# Patient Record
Sex: Male | Born: 1980 | Race: Black or African American | Hispanic: No | Marital: Single | Smoking: Never smoker
Health system: Southern US, Community
[De-identification: ages and names within clinical notes are randomized; demographics above are authoritative.]

---

## 2002-01-26 ENCOUNTER — Emergency Department (HOSPITAL_COMMUNITY): Admission: EM | Admit: 2002-01-26 | Discharge: 2002-01-26 | Payer: Self-pay | Admitting: Emergency Medicine

## 2002-01-26 ENCOUNTER — Encounter: Payer: Self-pay | Admitting: Emergency Medicine

## 2005-07-17 ENCOUNTER — Emergency Department (HOSPITAL_COMMUNITY): Admission: EM | Admit: 2005-07-17 | Discharge: 2005-07-17 | Payer: Self-pay | Admitting: Family Medicine

## 2007-04-26 ENCOUNTER — Emergency Department (HOSPITAL_COMMUNITY): Admission: EM | Admit: 2007-04-26 | Discharge: 2007-04-26 | Payer: Self-pay | Admitting: Family Medicine

## 2009-01-09 ENCOUNTER — Emergency Department (HOSPITAL_COMMUNITY): Admission: EM | Admit: 2009-01-09 | Discharge: 2009-01-09 | Payer: Self-pay | Admitting: Emergency Medicine

## 2010-08-29 LAB — GC/CHLAMYDIA PROBE AMP, GENITAL: GC Probe Amp, Genital: POSITIVE — AB

## 2011-02-01 ENCOUNTER — Emergency Department (HOSPITAL_COMMUNITY)
Admission: EM | Admit: 2011-02-01 | Discharge: 2011-02-01 | Disposition: A | Discharge status: Home / Self Care | Payer: Self-pay | Attending: Emergency Medicine | Admitting: Emergency Medicine

## 2011-02-01 DIAGNOSIS — R369 Urethral discharge, unspecified: Secondary | ICD-10-CM | POA: Insufficient documentation

## 2011-02-01 DIAGNOSIS — N342 Other urethritis: Secondary | ICD-10-CM | POA: Insufficient documentation

## 2011-02-01 DIAGNOSIS — R209 Unspecified disturbances of skin sensation: Secondary | ICD-10-CM | POA: Insufficient documentation

## 2012-12-08 ENCOUNTER — Emergency Department (HOSPITAL_COMMUNITY)
Admission: EM | Admit: 2012-12-08 | Discharge: 2012-12-08 | Disposition: A | Discharge status: Home / Self Care | Payer: Self-pay | Attending: Emergency Medicine | Admitting: Emergency Medicine

## 2012-12-08 ENCOUNTER — Encounter (HOSPITAL_COMMUNITY): Payer: Self-pay | Admitting: *Deleted

## 2012-12-08 DIAGNOSIS — N342 Other urethritis: Secondary | ICD-10-CM | POA: Insufficient documentation

## 2012-12-08 DIAGNOSIS — R300 Dysuria: Secondary | ICD-10-CM | POA: Insufficient documentation

## 2012-12-08 MED ORDER — CEFTRIAXONE SODIUM 250 MG IJ SOLR
250.0000 mg | Freq: Once | INTRAMUSCULAR | Status: AC
  Administered 2012-12-08: 250 mg via INTRAMUSCULAR
  Filled 2012-12-08: qty 250

## 2012-12-08 MED ORDER — LIDOCAINE HCL (PF) 1 % IJ SOLN
INTRAMUSCULAR | Status: AC
  Administered 2012-12-08: 5 mL
  Filled 2012-12-08: qty 5

## 2012-12-08 MED ORDER — AZITHROMYCIN 250 MG PO TABS
1000.0000 mg | ORAL_TABLET | Freq: Once | ORAL | Status: AC
  Administered 2012-12-08: 1000 mg via ORAL
  Filled 2012-12-08: qty 4

## 2012-12-08 NOTE — ED Provider Notes (Signed)
History/physical exam/procedure(s) were performed by non-physician practitioner and as supervising physician I was immediately available for consultation/collaboration. I have reviewed all notes and am in agreement with care and plan.   Hilario Quarry, MD 12/08/12 [phone]

## 2012-12-08 NOTE — ED Notes (Signed)
Reports having burning pain and penile discharge x 4 days. No distress noted at triage.

## 2012-12-08 NOTE — ED Provider Notes (Signed)
   History    CSN: [id number] Arrival date & time 12/08/12  1042  First MD Initiated Contact with Patient 12/08/12 1128     Chief Complaint  Patient presents with  . Penile Discharge   (Consider location/radiation/quality/duration/timing/severity/associated sxs/prior Treatment) HPI Comments: Patient presents with complaint of clear penile discharge and a tingling sensation with urination for the past 4 days. Patient admits to having unprotected sexual intercourse one week ago. He is uncertain if his partner had a 60 transmitted disease. Patient has history of 6 L and related disease "a long time ago". He denies other symptoms including fever, sore throat, skin lesions. Onset of symptoms gradual. Course is constant. Nothing makes symptoms better or worse.  Patient is a 32 y.o. male presenting with penile discharge. The history is provided by the patient.  Penile Discharge Pertinent negatives include no arthralgias, fever, rash or sore throat.   History reviewed. No pertinent past medical history. History reviewed. No pertinent past surgical history. History reviewed. No pertinent family history. History  Substance Use Topics  . Smoking status: Not on file  . Smokeless tobacco: Not on file  . Alcohol Use: No    Review of Systems  Constitutional: Negative for fever.  HENT: Negative for sore throat.   Eyes: Negative for discharge.  Gastrointestinal: Negative for rectal pain.  Genitourinary: Positive for dysuria and discharge. Negative for frequency, genital sores, penile pain and testicular pain.  Musculoskeletal: Negative for arthralgias.  Skin: Negative for rash.  Hematological: Negative for adenopathy.    Allergies  Review of patient's allergies indicates no known allergies.  Home Medications   Current Outpatient Rx  Name  Route  Sig  Dispense  Refill  . Multiple Vitamins-Minerals (MULTIVITAMIN WITH MINERALS) tablet   Oral   Take 1 tablet by mouth daily.          BP  129/82  Pulse 68  Temp(Src) 98 F (36.7 C) (Oral)  Resp 18  SpO2 97% Physical Exam  Nursing note and vitals reviewed. Constitutional: He appears well-developed and well-nourished.  HENT:  Head: Normocephalic and atraumatic.  Eyes: Conjunctivae are normal.  Neck: Normal range of motion. Neck supple.  Pulmonary/Chest: No respiratory distress.  Genitourinary: Testes normal and penis normal. Circumcised. No penile erythema or penile tenderness. No discharge found.  Neurological: He is alert.  Skin: Skin is warm and dry.  Psychiatric: He has a normal mood and affect.    ED Course  Procedures (including critical care time) Labs Reviewed - No data to display No results found. 1. Urethritis     11:41 AM Patient seen and examined. Work-up initiated. Medications ordered.   Vital signs reviewed and are as follows: Filed Vitals:   12/08/12 1053  BP: 129/82  Pulse: 68  Temp: 98 F (36.7 C)  Resp: 18   Pt seen and examined.  Will test and treat for STD exposure.  Patient counseled on safe sexual practices.  Told them that they should not have sexual contact for next 7 days and that they need to inform sexual partners so that they can get tested and treated as well.  Urged f/u with [place] [facility] for HIV and syphilis testing.  Patient verbalizes understanding and agrees with plan.      MDM  Penile d/c. No systemic symptoms. Pt appears well. Treated presumptively for GC/chlamydia.   Karam Dunson, PA-C 12/08/12 1146

## 2013-05-01 ENCOUNTER — Emergency Department (HOSPITAL_COMMUNITY)
Admission: EM | Admit: 2013-05-01 | Discharge: 2013-05-01 | Disposition: A | Discharge status: Home / Self Care | Payer: Self-pay | Attending: Emergency Medicine | Admitting: Emergency Medicine

## 2013-05-01 ENCOUNTER — Encounter (HOSPITAL_COMMUNITY): Payer: Self-pay | Admitting: Emergency Medicine

## 2013-05-01 DIAGNOSIS — J069 Acute upper respiratory infection, unspecified: Secondary | ICD-10-CM | POA: Insufficient documentation

## 2013-05-01 DIAGNOSIS — H5789 Other specified disorders of eye and adnexa: Secondary | ICD-10-CM | POA: Insufficient documentation

## 2013-05-01 MED ORDER — GUAIFENESIN ER 1200 MG PO TB12: 1.0000 | ORAL_TABLET | Freq: Two times a day (BID) | ORAL | Status: DC

## 2013-05-01 MED ORDER — PROMETHAZINE-DM 6.25-15 MG/5ML PO SYRP: 5.0000 mL | ORAL_SOLUTION | Freq: Four times a day (QID) | ORAL | Status: DC | PRN

## 2013-05-01 MED ORDER — IBUPROFEN 800 MG PO TABS: 800.0000 mg | ORAL_TABLET | Freq: Three times a day (TID) | ORAL | Status: DC | PRN

## 2013-05-01 NOTE — Progress Notes (Signed)
[facility] did not get to see patient but will be sending information about the [facility] Orange Card program, using the address provided.  °

## 2013-05-01 NOTE — ED Provider Notes (Signed)
CSN: [id number]     Arrival date & time 05/01/13  0741 History   First MD Initiated Contact with Patient 05/01/13 0750     Chief Complaint  Patient presents with  . Cough   (Consider location/radiation/quality/duration/timing/severity/associated sxs/prior Treatment) HPI Patient is a 32 yo male with no significant PMH who presents to the Emergency Department complaining of a dry cough for the past 5 days. Patient reports that he came in today because he woke up with crust on his eyes that concerned him. Patient reports that the cough has not changed since it started. It has been mostly dry with a small amount of sputum production. He denies hemoptysis. Patient reports that the cough is worse when he lays down and in the morning. He reports that he feels like his nose is dry and reports chills. He took some delsym yesterday without relief. Patient denies fever, shortness of breath, chest pain, sore throat, ear pain, sinus pain, neck pain, abdominal pain, nausea, vomiting, and diarrhea. Patient denies tobacco use.   History reviewed. No pertinent past medical history. History reviewed. No pertinent past surgical history. No family history on file. History  Substance Use Topics  . Smoking status: Never Smoker   . Smokeless tobacco: Not on file  . Alcohol Use: No    Review of Systems All other systems negative except as documented in the HPI. All pertinent positives and negatives as reviewed in the HPI.  Allergies  Review of patient's allergies indicates no known allergies.  Home Medications  No current outpatient prescriptions on file. BP 135/98  Pulse 64  Temp(Src) 97.8 F (36.6 C)  Resp 20  SpO2 99% Physical Exam  Nursing note and vitals reviewed. Constitutional: He is oriented to person, place, and time. He appears well-developed and well-nourished. No distress.  HENT:  Head: Normocephalic and atraumatic.  Right Ear: External ear and ear canal normal. No tenderness.  Left Ear:  External ear and ear canal normal. No tenderness.  Nose: No mucosal edema or rhinorrhea. Right sinus exhibits no maxillary sinus tenderness and no frontal sinus tenderness. Left sinus exhibits no maxillary sinus tenderness and no frontal sinus tenderness.  Mouth/Throat: Mucous membranes are normal. No oropharyngeal exudate or posterior oropharyngeal erythema.  Post nasal drainage in posterior oropharynx.   Eyes: Conjunctivae are normal. Pupils are equal, round, and reactive to light. Right eye exhibits no discharge. Left eye exhibits no discharge.  Neck: Normal range of motion. Neck supple.  Cardiovascular: Normal rate, regular rhythm and normal heart sounds.  Exam reveals no gallop and no friction rub.   No murmur heard. Pulmonary/Chest: Effort normal and breath sounds normal. No respiratory distress. He has no wheezes. He has no rales.  Lymphadenopathy:    He has no cervical adenopathy.  Neurological: He is alert and oriented to person, place, and time.  Skin: Skin is warm and dry.    ED Course  Procedures (including critical care time) Patient will be treated for viral URI, based on his history of present illness and physical exam. His vital signs are stable.  He is advised to increase his fluid intake, and rest as much as possible.  He is told to return to the emergency department for any worsening in his condition and all questions were answered.  Patient voices an understanding of the plan   Carlyle Dolly, [place] 05/01/13 [phone]

## 2013-05-01 NOTE — ED Provider Notes (Signed)
Medical screening examination/treatment/procedure(s) were performed by non-physician practitioner and as supervising physician I was immediately available for consultation/collaboration.  EKG Interpretation   None         Lyanne Co, MD 05/01/13 [phone]

## 2013-05-01 NOTE — ED Notes (Signed)
Dry cough since Wednesday; eyes crusty this morning

## 2013-07-13 ENCOUNTER — Emergency Department (HOSPITAL_COMMUNITY)
Admission: EM | Admit: 2013-07-13 | Discharge: 2013-07-13 | Disposition: A | Discharge status: Home / Self Care | Payer: Self-pay | Attending: Emergency Medicine | Admitting: Emergency Medicine

## 2013-07-13 ENCOUNTER — Encounter (HOSPITAL_COMMUNITY): Payer: Self-pay | Admitting: Emergency Medicine

## 2013-07-13 DIAGNOSIS — Z8619 Personal history of other infectious and parasitic diseases: Secondary | ICD-10-CM | POA: Insufficient documentation

## 2013-07-13 DIAGNOSIS — R369 Urethral discharge, unspecified: Secondary | ICD-10-CM | POA: Insufficient documentation

## 2013-07-13 MED ORDER — CEFIXIME 400 MG PO TABS
400.0000 mg | ORAL_TABLET | Freq: Once | ORAL | Status: AC
  Administered 2013-07-13: 400 mg via ORAL
  Filled 2013-07-13: qty 1

## 2013-07-13 MED ORDER — METRONIDAZOLE 500 MG PO TABS
2000.0000 mg | ORAL_TABLET | Freq: Once | ORAL | Status: AC
  Administered 2013-07-13: 2000 mg via ORAL
  Filled 2013-07-13: qty 4

## 2013-07-13 MED ORDER — AZITHROMYCIN 250 MG PO TABS
1000.0000 mg | ORAL_TABLET | Freq: Once | ORAL | Status: AC
  Administered 2013-07-13: 1000 mg via ORAL
  Filled 2013-07-13: qty 4

## 2013-07-13 NOTE — ED Provider Notes (Signed)
CSN: [id number]     Arrival date & time 07/13/13  [id number] History   First MD Initiated Contact with Patient 07/13/13 1016     Chief Complaint  Patient presents with  . Penile Discharge     (Consider location/radiation/quality/duration/timing/severity/associated sxs/prior Treatment) Patient is a 33 y.o. male presenting with penile discharge.  Penile Discharge   33 yo black male with PMH of Gonorrhea presents with chief complaint of penile discharge. Penile discharge and itching started 5 days ago. He has had a new partner with whom he has had oral sex with. Denies nausea, vomiting, abdominal pain, chest pain, SOB, dysuria, increased frequency, urgency, and fever.   History reviewed. No pertinent past medical history. History reviewed. No pertinent past surgical history. No family history on file. History  Substance Use Topics  . Smoking status: Never Smoker   . Smokeless tobacco: Not on file  . Alcohol Use: No    Review of Systems  Genitourinary: Positive for discharge.  All other systems negative except as documented in the HPI. All pertinent positives and negatives as reviewed in the HPI.  Review of Systems  Genitourinary: Positive for discharge.      Allergies  Review of patient's allergies indicates no known allergies.  Home Medications   Current Outpatient Rx  Name  Route  Sig  Dispense  Refill  . Multiple Vitamins-Minerals (MULTIVITAMIN WITH MINERALS) tablet   Oral   Take 1 tablet by mouth daily.          BP 124/79  Pulse 61  Temp(Src) 98.3 F (36.8 C) (Oral)  Resp 16  SpO2 100% Physical Exam  Constitutional: He appears well-developed and well-nourished. No distress.  HENT:  Head: Normocephalic and atraumatic.  Neck: Normal range of motion. Neck supple.  Pulmonary/Chest: Effort normal.  Genitourinary: Testes normal. Circumcised. No penile tenderness. Discharge found.  Skin: Skin is warm and dry.    ED Course  Procedures (including critical care  time) Patient with PMH of Gonorrhea presented with 5 days of penile discharge. Will swab for GC/Chlamydia and treat prophylactically with Metronidazole, Azithromycin, and Cefixime.      Carlyle DollyChristopher W Alla Sloma, PA-C 07/13/13 1116

## 2013-07-13 NOTE — ED Notes (Signed)
Per pt, doesn't know if he has STD-states he has itch in private area-has some discharge

## 2013-07-13 NOTE — Discharge Instructions (Signed)
Return here as needed. Follow up with your doctor or the one provided. °

## 2013-07-13 NOTE — Progress Notes (Signed)
[facility] provided pt with a list of primary care resources to help patient establish primary care.  °

## 2013-07-14 LAB — GC/CHLAMYDIA PROBE AMP
CT Probe RNA: NEGATIVE
GC Probe RNA: NEGATIVE

## 2013-07-16 NOTE — ED Provider Notes (Signed)
Medical screening examination/treatment/procedure(s) were performed by non-physician practitioner and as supervising physician I was immediately available for consultation/collaboration.  EKG Interpretation   None         Deztiny Sarra, MD 07/16/13 0746 

## 2015-01-17 ENCOUNTER — Emergency Department (HOSPITAL_COMMUNITY)
Admission: EM | Admit: 2015-01-17 | Discharge: 2015-01-17 | Disposition: A | Discharge status: Home / Self Care | Payer: PRIVATE HEALTH INSURANCE | Attending: Emergency Medicine | Admitting: Emergency Medicine

## 2015-01-17 ENCOUNTER — Encounter (HOSPITAL_COMMUNITY): Payer: Self-pay | Admitting: *Deleted

## 2015-01-17 DIAGNOSIS — R360 Urethral discharge without blood: Secondary | ICD-10-CM | POA: Insufficient documentation

## 2015-01-17 DIAGNOSIS — Z202 Contact with and (suspected) exposure to infections with a predominantly sexual mode of transmission: Secondary | ICD-10-CM | POA: Diagnosis present

## 2015-01-17 DIAGNOSIS — R369 Urethral discharge, unspecified: Secondary | ICD-10-CM

## 2015-01-17 DIAGNOSIS — Z79899 Other long term (current) drug therapy: Secondary | ICD-10-CM | POA: Insufficient documentation

## 2015-01-17 MED ORDER — AZITHROMYCIN 250 MG PO TABS
1000.0000 mg | ORAL_TABLET | Freq: Once | ORAL | Status: AC
  Administered 2015-01-17: 1000 mg via ORAL
  Filled 2015-01-17: qty 4

## 2015-01-17 MED ORDER — CEFTRIAXONE SODIUM 250 MG IJ SOLR
250.0000 mg | Freq: Once | INTRAMUSCULAR | Status: AC
  Administered 2015-01-17: 250 mg via INTRAMUSCULAR
  Filled 2015-01-17: qty 250

## 2015-01-17 NOTE — ED Provider Notes (Signed)
CSN: [id number]     Arrival date & time 01/17/15  1401 History  This chart was scribed for non-physician practitioner Ebbie Ridge, PA-C working with Gwyneth Sprout, MD by Lyndel Safe, ED Scribe. This patient was seen in room TR09C/TR09C and the patient's care was started at 2:26 PM.   Chief Complaint  Patient presents with  . SEXUALLY TRANSMITTED DISEASE   The history is provided by the patient. No language interpreter was used.   HPI Comments: Jeff Ramos is a 34 y.o. male who presents to the Emergency Department complaining of sudden onset, constant, mild penile discharge that began 1 day ago s/p sexual encounter. Pt reports 3 days ago he had a sexual encounter but denies penetration during this encounter. He additionally denies any significant PMhx, abdominal pain, dysuria, or fever.   History reviewed. No pertinent past medical history. History reviewed. No pertinent past surgical history. History reviewed. No pertinent family history. Social History  Substance Use Topics  . Smoking status: Never Smoker   . Smokeless tobacco: None  . Alcohol Use: No    Review of Systems  Constitutional: Negative for fever.  Gastrointestinal: Negative for abdominal pain.  Genitourinary: Positive for discharge. Negative for dysuria.  All other systems reviewed and are negative.  Allergies  Review of patient's allergies indicates no known allergies.  Home Medications   Prior to Admission medications   Medication Sig Start Date End Date Taking? Authorizing Provider  Multiple Vitamins-Minerals (MULTIVITAMIN WITH MINERALS) tablet Take 1 tablet by mouth daily.    Historical Provider, MD   BP 126/75 mmHg  Pulse 60  Temp(Src) 98.1 F (36.7 C) (Oral)  SpO2 99% Physical Exam  Constitutional: He is oriented to person, place, and time. He appears well-developed and well-nourished.  HENT:  Head: Normocephalic and atraumatic.  Eyes: Pupils are equal, round, and reactive to light.  Neck:  Normal range of motion. Neck supple.  Pulmonary/Chest: Effort normal.  Genitourinary: Testes normal. Discharge found.  Neurological: He is alert and oriented to person, place, and time.  Skin: Skin is warm and dry. No rash noted. No erythema.  Nursing note and vitals reviewed.   ED Course  Procedures  DIAGNOSTIC STUDIES: Oxygen Saturation is 99% on RA, normal by my interpretation.    COORDINATION OF CARE: 2:30 PM Discussed treatment plan which includes to order Rocephin and Zithromax with pt. Pt acknowledges and agrees to plan.    I have personally reviewed and evaluated these images and lab results as part of my medical decision-making.  Patient was treated with Rocephin and Zithromax.  He is advised to return here as needed.  Given safe sex practices  Charlestine Night, [place] 01/17/15 1514  Gwyneth Sprout, MD 01/17/15 1626

## 2015-01-17 NOTE — ED Notes (Signed)
Pts vital signs updated. Pt awaiting discharge paperwork at bedside.  

## 2015-01-17 NOTE — Discharge Instructions (Signed)
Return here as needed.  Follow up with the health department as needed °

## 2015-01-17 NOTE — ED Notes (Signed)
Pt reports having penile discharge since yesterday.

## 2015-01-20 LAB — GC/CHLAMYDIA PROBE AMP (~~LOC~~) NOT AT ARMC
Chlamydia: NEGATIVE
Neisseria Gonorrhea: NEGATIVE

## 2015-10-16 ENCOUNTER — Emergency Department (HOSPITAL_COMMUNITY)
Admission: EM | Admit: 2015-10-16 | Discharge: 2015-10-16 | Disposition: A | Discharge status: Home / Self Care | Payer: PRIVATE HEALTH INSURANCE | Attending: Emergency Medicine | Admitting: Emergency Medicine

## 2015-10-16 ENCOUNTER — Encounter (HOSPITAL_COMMUNITY): Payer: Self-pay | Admitting: Emergency Medicine

## 2015-10-16 DIAGNOSIS — N342 Other urethritis: Secondary | ICD-10-CM | POA: Insufficient documentation

## 2015-10-16 DIAGNOSIS — Z79899 Other long term (current) drug therapy: Secondary | ICD-10-CM | POA: Insufficient documentation

## 2015-10-16 LAB — URINALYSIS, ROUTINE W REFLEX MICROSCOPIC
Bilirubin Urine: NEGATIVE
GLUCOSE, UA: NEGATIVE mg/dL
Hgb urine dipstick: NEGATIVE
Ketones, ur: 15 mg/dL — AB
Leukocytes, UA: NEGATIVE
Nitrite: NEGATIVE
Protein, ur: NEGATIVE mg/dL
Specific Gravity, Urine: 1.024 (ref 1.005–1.030)
pH: 5.5 (ref 5.0–8.0)

## 2015-10-16 MED ORDER — AZITHROMYCIN 250 MG PO TABS
1000.0000 mg | ORAL_TABLET | Freq: Once | ORAL | Status: AC
  Administered 2015-10-16: 1000 mg via ORAL
  Filled 2015-10-16: qty 4

## 2015-10-16 NOTE — ED Notes (Signed)
Declined W/C at D/C and was escorted to lobby by RN. 

## 2015-10-16 NOTE — ED Notes (Signed)
Pt sts penile discharge and itching x 4 days

## 2015-10-16 NOTE — Discharge Instructions (Signed)
Urethritis, Adult Urethritis is an inflammation of the tube through which urine exits your bladder (urethra).  CAUSES Urethritis is often caused by an infection in your urethra. The infection can be viral, like herpes. The infection can also be bacterial, like gonorrhea. RISK FACTORS Risk factors of urethritis include:  Having sex without using a condom.  Having multiple sexual partners.  Having poor hygiene. SIGNS AND SYMPTOMS Symptoms of urethritis are less noticeable in women than in men. These symptoms include:  Burning feeling when you urinate (dysuria).  Discharge from your urethra.  Blood in your urine (hematuria).  Urinating more than usual. DIAGNOSIS  To confirm a diagnosis of urethritis, your health care provider will do the following:  Ask about your sexual history.  Perform a physical exam.  Have you provide a sample of your urine for lab testing.  Use a cotton swab to gently collect a sample from your urethra for lab testing. TREATMENT  It is important to treat urethritis. Depending on the cause, untreated urethritis may lead to serious genital infections and possibly infertility. Urethritis caused by a bacterial infection is treated with antibiotic medicine. All sexual partners must be treated.  HOME CARE INSTRUCTIONS  Do not have sex until the test results are known and treatment is completed, even if your symptoms go away before you finish treatment.  If you were prescribed an antibiotic, finish it all even if you start to feel better. SEEK MEDICAL CARE IF:   Your symptoms are not improved in 3 days.  Your symptoms are getting worse.  You develop abdominal pain or pelvic pain (in women).  You develop joint pain.  You have a fever. SEEK IMMEDIATE MEDICAL CARE IF:   You have severe pain in the belly, back, or side.  You have repeated vomiting. MAKE SURE YOU:  Understand these instructions.  Will watch your condition.  Will get help right away  if you are not doing well or get worse.   This information is not intended to replace advice given to you by your health care provider. Make sure you discuss any questions you have with your health care provider.   Document Released: 11/03/2000 Document Revised: 09/24/2014 Document Reviewed: 01/08/2013 Elsevier Interactive Patient Education 2016 [employer]  YOU WILL BE CONTACTED IF YOUR STD SCREENING LABS INDICATE ADDITIONAL TREATMENT IS NEEDED.

## 2015-10-16 NOTE — ED Provider Notes (Signed)
History  By signing my name below, I, Jeff Ramos, attest that this documentation has been prepared under the direction and in the presence of Felicie Mornavid Bobbye Reinitz, FNP. Electronically Signed: Earmon PhoenixJennifer Ramos, ED Scribe. 10/16/2015. 4:16 PM.  Chief Complaint  Patient presents with  . Exposure to STD   The history is provided by the patient and medical records. No language interpreter was used.    HPI Comments:  Jeff Ramos is a 35 y.o. male who presents to the Emergency Department complaining of penile itching that began about 4 days ago. He reports associated clear penile discharge and irritation with urination. He reports being sexually active with one partner and uses protection only sometimes. He has not done anything to treat his symptoms. He denies modifying factors. He denies fever, chills, nausea, vomiting, dysuria, testicular pain, urinary incontinence.  History reviewed. No pertinent past medical history. History reviewed. No pertinent past surgical history. History reviewed. No pertinent family history. Social History  Substance Use Topics  . Smoking status: Never Smoker   . Smokeless tobacco: None  . Alcohol Use: No    Review of Systems  Constitutional: Negative for fever and chills.  Gastrointestinal: Negative for nausea and vomiting.  Genitourinary: Positive for discharge. Negative for dysuria, scrotal swelling and genital sores.  All other systems reviewed and are negative.   Allergies  Review of patient's allergies indicates no known allergies.  Home Medications   Prior to Admission medications   Medication Sig Start Date End Date Taking? Authorizing Provider  Multiple Vitamins-Minerals (MULTIVITAMIN WITH MINERALS) tablet Take 1 tablet by mouth daily.    Historical Provider, MD   Triage Vitals: BP 139/93 mmHg  Pulse 59  Temp(Src) 97.3 F (36.3 C) (Oral)  Resp 18  SpO2 99% Physical Exam  Constitutional: He is oriented to person, place, and time. He  appears well-developed and well-nourished.  HENT:  Head: Normocephalic and atraumatic.  Eyes: EOM are normal.  Neck: Normal range of motion.  Cardiovascular: Normal rate.   Pulmonary/Chest: Effort normal.  Genitourinary: Testes normal. Circumcised. No penile tenderness.  Normal male genitalia. No testicle pain. No obvious lesions or drainage.  Musculoskeletal: Normal range of motion.  Neurological: He is alert and oriented to person, place, and time.  Skin: Skin is warm and dry.  Psychiatric: He has a normal mood and affect. His behavior is normal.  Nursing note and vitals reviewed.   ED Course  Procedures (including critical care time) DIAGNOSTIC STUDIES: Oxygen Saturation is 99% on RA, normal by my interpretation.   COORDINATION OF CARE: 4:14 PM- Will order STD panel and urinalysis. Pt verbalizes understanding and agrees to plan.  Medications - No data to display  Labs Review Labs Reviewed  RPR  HIV ANTIBODY (ROUTINE TESTING)  URINALYSIS, ROUTINE W REFLEX MICROSCOPIC (NOT AT [facility])  GC/CHLAMYDIA PROBE AMP ([place]) NOT AT [facility]    Imaging Review No results found. I have personally reviewed and evaluated these images and lab results as part of my medical decision-making.   EKG Interpretation None      MDM   Final diagnoses:  None  Patient treated in the ED for urethritis with azithromycin. Patient advised to inform all sexual partners.  Pt advised on safe sex practices and understands that they have GC/Chlamydia cultures pending and will result in 2-3 days. HIV and RPR sent. Pt encouraged to follow up at local health department for future STI checks. No concern for prostatitis or epididymitis. Discussed return precautions. Pt appears safe for  discharge.   I personally performed the services described in this documentation, which was scribed in my presence. The recorded information has been reviewed and is accurate.      Felicie Morn, NP 10/16/15  1825  Mancel Bale, MD 10/16/15 2351

## 2015-10-17 LAB — RPR: RPR: NONREACTIVE

## 2015-10-17 LAB — GC/CHLAMYDIA PROBE AMP (~~LOC~~) NOT AT ARMC
Chlamydia: NEGATIVE
Neisseria Gonorrhea: NEGATIVE

## 2015-10-17 LAB — HIV ANTIBODY (ROUTINE TESTING W REFLEX): HIV SCREEN 4TH GENERATION: NONREACTIVE

## 2016-05-12 ENCOUNTER — Emergency Department (HOSPITAL_COMMUNITY)
Admission: EM | Admit: 2016-05-12 | Discharge: 2016-05-12 | Disposition: A | Discharge status: Home / Self Care | Payer: No Typology Code available for payment source | Attending: Emergency Medicine | Admitting: Emergency Medicine

## 2016-05-12 ENCOUNTER — Encounter (HOSPITAL_COMMUNITY): Payer: Self-pay | Admitting: Neurology

## 2016-05-12 DIAGNOSIS — R369 Urethral discharge, unspecified: Secondary | ICD-10-CM | POA: Diagnosis present

## 2016-05-12 DIAGNOSIS — N341 Nonspecific urethritis: Secondary | ICD-10-CM | POA: Insufficient documentation

## 2016-05-12 DIAGNOSIS — N342 Other urethritis: Secondary | ICD-10-CM

## 2016-05-12 LAB — URINALYSIS, ROUTINE W REFLEX MICROSCOPIC
BILIRUBIN URINE: NEGATIVE
Glucose, UA: NEGATIVE mg/dL
HGB URINE DIPSTICK: NEGATIVE
Ketones, ur: NEGATIVE mg/dL
Leukocytes, UA: NEGATIVE
Nitrite: NEGATIVE
PH: 5 (ref 5.0–8.0)
Protein, ur: NEGATIVE mg/dL
Specific Gravity, Urine: 1.025 (ref 1.005–1.030)

## 2016-05-12 LAB — WET PREP, GENITAL
Clue Cells Wet Prep HPF POC: NONE SEEN
Sperm: NONE SEEN
TRICH WET PREP: NONE SEEN
Yeast Wet Prep HPF POC: NONE SEEN

## 2016-05-12 MED ORDER — LIDOCAINE HCL (PF) 1 % IJ SOLN
INTRAMUSCULAR | Status: AC
  Administered 2016-05-12: 5 mL
  Filled 2016-05-12: qty 5

## 2016-05-12 MED ORDER — AZITHROMYCIN 250 MG PO TABS
1000.0000 mg | ORAL_TABLET | Freq: Once | ORAL | Status: AC
  Administered 2016-05-12: 1000 mg via ORAL
  Filled 2016-05-12: qty 4

## 2016-05-12 MED ORDER — METRONIDAZOLE 500 MG PO TABS
2000.0000 mg | ORAL_TABLET | Freq: Once | ORAL | Status: AC
  Administered 2016-05-12: 2000 mg via ORAL
  Filled 2016-05-12: qty 4

## 2016-05-12 MED ORDER — CEFTRIAXONE SODIUM 250 MG IJ SOLR
250.0000 mg | INTRAMUSCULAR | Status: DC
  Administered 2016-05-12: 250 mg via INTRAMUSCULAR
  Filled 2016-05-12: qty 250

## 2016-05-12 NOTE — Discharge Instructions (Signed)
Use condoms.  You will be contacted if any additional infections test positive on the swabs obtained today.

## 2016-05-12 NOTE — ED Provider Notes (Signed)
[facility]-EMERGENCY DEPT Provider Note   CSN: [id number] Arrival date & time: 05/12/16  1604  By signing my name below, I, Rosario AdieWilliam Andrew Hiatt, attest that this documentation has been prepared under the direction and in the presence of Rolland PorterMark Mahli Glahn, MD. Electronically Signed: Rosario AdieWilliam Andrew Hiatt, ED Scribe. 05/12/16. 5:04 PM.  History   Chief Complaint Chief Complaint  Patient presents with  . Penile Discharge   The history is provided by the patient. No language interpreter was used.    HPI Comments: Jeff Ramos is a 35 y.o. male with no pertinent PMHx, who presents to the Emergency Department complaining of intermittent episodes of clear penile discharge onset approximately 4 days ago. He notes that he only notices this clear discharge when he squeezes the tip of his penis. Pt reports associated sensation of tingling to the tip of his penis secondary to the onset of his penile discharge. Pt is currently sexual active and is not currently in between sexual partners partners. He has previously been treated for Chlamydia in the past, which was resolved following a course of abx. No h/o DM. He is not currently medicated for any reason daily. Denies urgency, frequency, hematuria, dysuria, difficulty urinating, or any other associated symptoms.   History reviewed. No pertinent past medical history.  There are no active problems to display for this patient.  History reviewed. No pertinent surgical history.  Home Medications    Prior to Admission medications   Medication Sig Start Date End Date Taking? Authorizing Provider  Multiple Vitamins-Minerals (MULTIVITAMIN WITH MINERALS) tablet Take 1 tablet by mouth daily.    Historical Provider, MD   Family History No family history on file.  Social History Social History  Substance Use Topics  . Smoking status: Never Smoker  . Smokeless tobacco: Not on file  . Alcohol use No   Allergies   Patient has no known allergies.  Review of  Systems Review of Systems  Constitutional: Negative for appetite change, chills, diaphoresis, fatigue and fever.  HENT: Negative for mouth sores, sore throat and trouble swallowing.   Eyes: Negative for visual disturbance.  Respiratory: Negative for cough, chest tightness, shortness of breath and wheezing.   Cardiovascular: Negative for chest pain.  Gastrointestinal: Negative for abdominal distention, abdominal pain, diarrhea, nausea and vomiting.  Endocrine: Negative for polydipsia, polyphagia and polyuria.  Genitourinary: Positive for discharge. Negative for difficulty urinating, dysuria, frequency, hematuria and urgency.  Musculoskeletal: Negative for gait problem.  Skin: Negative for color change, pallor and rash.  Neurological: Negative for dizziness, syncope, light-headedness and headaches.  Hematological: Does not bruise/bleed easily.  Psychiatric/Behavioral: Negative for behavioral problems and confusion.   Physical Exam Updated Vital Signs BP 120/98 (BP Location: Left Arm)   Pulse 73   Temp 98.7 F (37.1 C) (Oral)   Resp 16   SpO2 100%   Physical Exam  Constitutional: He is oriented to person, place, and time. He appears well-developed and well-nourished. No distress.  HENT:  Head: Normocephalic.  Eyes: Conjunctivae are normal. Pupils are equal, round, and reactive to light. No scleral icterus.  Neck: Normal range of motion. Neck supple. No thyromegaly present.  Cardiovascular: Normal rate and regular rhythm.  Exam reveals no gallop and no friction rub.   No murmur heard. Pulmonary/Chest: Effort normal and breath sounds normal. No respiratory distress. He has no wheezes. He has no rales.  Abdominal: Soft. Bowel sounds are normal. He exhibits no distension. There is no tenderness. There is no rebound. Hernia confirmed negative  in the right inguinal area and confirmed negative in the left inguinal area.  Genitourinary: Penis normal. Right testis shows no mass, no swelling  and no tenderness. Left testis shows no mass, no swelling and no tenderness. No penile tenderness. No discharge found.  Genitourinary Comments: Chaperone present throughout entire exam. No discharge evident. Otherwise normal exam.   Musculoskeletal: Normal range of motion.  Lymphadenopathy: No inguinal adenopathy noted on the right or left side.  Neurological: He is alert and oriented to person, place, and time.  Skin: Skin is warm and dry. No rash noted.  Psychiatric: He has a normal mood and affect. His behavior is normal.   ED Treatments / Results  DIAGNOSTIC STUDIES: Oxygen Saturation is 100% on RA, normal by my interpretation.   COORDINATION OF CARE: 4:46 PM-Discussed next steps with pt. Pt verbalized understanding and is agreeable with the plan.   Labs (all labs ordered are listed, but only abnormal results are displayed) Labs Reviewed  WET PREP, GENITAL  URINALYSIS, ROUTINE W REFLEX MICROSCOPIC  GC/CHLAMYDIA PROBE AMP ([place]   EKG  EKG Interpretation None      Radiology No results found.  Procedures Procedures   Medications Ordered in ED Medications  cefTRIAXone (ROCEPHIN) injection 250 mg (not administered)  metroNIDAZOLE (FLAGYL) tablet 2,000 mg (not administered)  azithromycin (ZITHROMAX) tablet 1,000 mg (not administered)    Initial Impression / Assessment and Plan / ED Course  I have reviewed the triage vital signs and the nursing notes.  Pertinent labs & imaging results that were available during my care of the patient were reviewed by me and considered in my medical decision making (see chart for details).  Clinical Course      Final Clinical Impressions(s) / ED Diagnoses   Final diagnoses:  Penile discharge   New Prescriptions New Prescriptions   No medications on file   Patient offered choice of waiting results, versus empiric treatment. He chooses earlier. Treatment. Given Rocephin 250 IM, Zithromax 1 g by mouth, Flagyl  2 g by mouth.    Rolland PorterMark Francis Yardley, MD 05/12/16 [phone]

## 2016-05-12 NOTE — ED Triage Notes (Signed)
Pt reports clear penile discharge when he squeezes his penis. Also reports burning in his penis intermittently. No problems urinating.

## 2016-05-13 LAB — GC/CHLAMYDIA PROBE AMP (~~LOC~~) NOT AT ARMC
CHLAMYDIA, DNA PROBE: NEGATIVE
Neisseria Gonorrhea: NEGATIVE

## 2017-08-22 ENCOUNTER — Emergency Department (HOSPITAL_COMMUNITY)
Admission: EM | Admit: 2017-08-22 | Discharge: 2017-08-22 | Disposition: A | Discharge status: Home / Self Care | Payer: No Typology Code available for payment source | Attending: Emergency Medicine | Admitting: Emergency Medicine

## 2017-08-22 ENCOUNTER — Encounter (HOSPITAL_COMMUNITY): Payer: Self-pay

## 2017-08-22 ENCOUNTER — Emergency Department (HOSPITAL_COMMUNITY): Payer: No Typology Code available for payment source

## 2017-08-22 DIAGNOSIS — R059 Cough, unspecified: Secondary | ICD-10-CM

## 2017-08-22 DIAGNOSIS — R05 Cough: Secondary | ICD-10-CM

## 2017-08-22 DIAGNOSIS — Z79899 Other long term (current) drug therapy: Secondary | ICD-10-CM | POA: Insufficient documentation

## 2017-08-22 MED ORDER — FLUTICASONE PROPIONATE 50 MCG/ACT NA SUSP: 1.0000 | Freq: Every day | NASAL | 2 refills | Status: DC

## 2017-08-22 MED ORDER — BENZONATATE 100 MG PO CAPS: 100.0000 mg | ORAL_CAPSULE | Freq: Three times a day (TID) | ORAL | 0 refills | Status: DC | PRN

## 2017-08-22 NOTE — Discharge Instructions (Signed)
You came to the ER for evaluation of cough. Your chest x-ray did not show signs of pneumonia or any other abnormalities with your heart and lungs.   We are unsure of the exact cause of your cough- we suspect this is viral or post nasal drip related at this time. For this reason we have given you a prescription for Flonase (this is a nasal spray- use this once in each nostril daily) and tessalon (this is for cough, use this every 8 hours as needed). We have prescribed you new medication(s) today. Discuss the medications prescribed today with your pharmacist as they can have adverse effects and interactions with your other medicines including over the counter and prescribed medications. Seek medical evaluation if you start to experience new or abnormal symptoms after taking one of these medicines, seek care immediately if you start to experience difficulty breathing, feeling of your throat closing, facial swelling, or rash as these could be indications of a more serious allergic reaction    Please follow up with your primary care doctor in 1 week for re-evaluation.

## 2017-08-22 NOTE — ED Notes (Signed)
Pt complains of a dry cough that he is concerned could have come from kissing. He does not report that his partners have been sick. Throat does not look irritated.

## 2017-08-22 NOTE — ED Provider Notes (Signed)
[facility] EMERGENCY DEPARTMENT Provider Note   CSN: [id number] Arrival date & time: 08/22/17  1627     History   Chief Complaint Chief Complaint  Patient presents with  . Cough    HPI Jeff Ramos is a 37 y.o. male without significant past medical hx who presents to the ED with complaint of cough x 1.5 weeks. Patient describes the cough as dry. No specific alleviating/aggravating factors. Has not tried at home intervention. States cough had onset following oral intercourse and is curious as to whether or not this is related, has not had abnormal appearance of throat or oral/posterior pharynx discharge. Also with concern for cancer- no fever, night sweats, weight loss, or tobacco abuse. Denies fever, chills, congestion, rhinorrhea, sore throat, dyspnea, or chest pain.   HPI  History reviewed. No pertinent past medical history.  There are no active problems to display for this patient.   History reviewed. No pertinent surgical history.      Home Medications    Prior to Admission medications   Medication Sig Start Date End Date Taking? Authorizing Provider  Multiple Vitamins-Minerals (MULTIVITAMIN WITH MINERALS) tablet Take 1 tablet by mouth daily.    [provider]    Family History History reviewed. No pertinent family history.  Social History Social History   Tobacco Use  . Smoking status: Never Smoker  . Smokeless tobacco: Never Used  Substance Use Topics  . Alcohol use: No  . Drug use: No     Allergies   Patient has no known allergies.   Review of Systems Review of Systems  Constitutional: Negative for chills and fever.  HENT: Negative for congestion, ear pain, rhinorrhea and sore throat.   Respiratory: Positive for cough. Negative for shortness of breath.   Cardiovascular: Negative for chest pain.  Gastrointestinal: Negative for vomiting.  Genitourinary: Negative for discharge and penile pain.    Physical  Exam Updated Vital Signs BP 136/85 (BP Location: Right Arm)   Pulse 66   Temp 98.2 F (36.8 C) (Oral)   Resp 16   Ht 5\' 11"  (1.803 m)   Wt 74.4 kg (164 lb)   SpO2 98%   BMI 22.87 kg/m   Physical Exam  Constitutional: He appears well-developed and well-nourished.  Non-toxic appearance. No distress.  HENT:  Head: Normocephalic and atraumatic.  Right Ear: Tympanic membrane is not perforated, not erythematous, not retracted and not bulging.  Left Ear: Tympanic membrane is not perforated, not erythematous, not retracted and not bulging.  Mouth/Throat: Uvula is midline. Posterior oropharyngeal erythema (minimal) present. No oropharyngeal exudate, posterior oropharyngeal edema or tonsillar abscesses.  Mild nasal congestion noted. No discharge in the posterior pharynx.   Eyes: Pupils are equal, round, and reactive to light. Conjunctivae are normal. Right eye exhibits no discharge. Left eye exhibits no discharge.  Cardiovascular: Normal rate and regular rhythm.  No murmur heard. Pulmonary/Chest: Effort normal and breath sounds normal. No stridor. No respiratory distress. He has no wheezes. He has no rales. He exhibits no tenderness.  Abdominal: Soft. He exhibits no distension.  Lymphadenopathy:    He has no cervical adenopathy.  Neurological: He is alert.  Clear speech.   Psychiatric: He has a normal mood and affect. His behavior is normal. Thought content normal.  Nursing note and vitals reviewed.   ED Treatments / Results  Labs (all labs ordered are listed, but only abnormal results are displayed) Labs Reviewed - No data to display  EKG None  Radiology  Dg Chest 2 View  Result Date: 08/22/2017 CLINICAL DATA:  Cough EXAM: CHEST - 2 VIEW COMPARISON:  None. FINDINGS: Hyperinflation. No focal opacity or pleural effusion. Normal heart size. No pneumothorax. IMPRESSION: No active cardiopulmonary disease. Electronically Signed   By: Jasmine PangKim  Fujinaga M.D.   On: 08/22/2017 19:03     Procedures Procedures (including critical care time)  Medications Ordered in ED Medications - No data to display   Initial Impression / Assessment and Plan / ED Course  I have reviewed the triage vital signs and the nursing notes.  Pertinent labs & imaging results that were available during my care of the patient were reviewed by me and considered in my medical decision making (see chart for details).   Patient presents with complaint of cough. Patient is nontoxic appearing, in no apparent distress, vitals WNL. Lungs CTA, no respiratory distress. CXR negative for infiltrate- doubt pneumonia. Centor Score 0- doubt strep. No indication of acute bacterial sinusitis. Patient with concern for cough relation to oral intercourse- patient without discharge or significant abnormalities in the oropharynx, no known exposure, doubt GC/chlamydia. Patient with concern for cancer- no fever/night sweats/weight loss/hx of tobacco use, doubt lung cancer. Will treat with flonase and tessalon with PCP follow up. I discussed results, treatment plan, need for PCP follow-up, and return precautions with the patient. Provided opportunity for questions, patient confirmed understanding and is in agreement with plan.   Final Clinical Impressions(s) / ED Diagnoses   Final diagnoses:  Cough    ED Discharge Orders        Ordered    fluticasone (FLONASE) 50 MCG/ACT nasal spray  Daily     08/22/17 1924    benzonatate (TESSALON) 100 MG capsule  3 times daily PRN     08/22/17 1924       Cherly Andersonetrucelli, Tahjae Clausing R, PA-C 08/22/17 2118    Terrilee FilesButler, Michael C, MD 08/23/17 1218

## 2017-08-22 NOTE — ED Triage Notes (Signed)
Pt reports dry cough x 1.5 weeks. Pt denies sore throat, sob. Pt reports giving oral sex and states he thinks it may be from that or lung cancer.

## 2017-09-08 ENCOUNTER — Emergency Department (HOSPITAL_COMMUNITY)
Admission: EM | Admit: 2017-09-08 | Discharge: 2017-09-08 | Disposition: A | Discharge status: Home / Self Care | Payer: Self-pay | Attending: Emergency Medicine | Admitting: Emergency Medicine

## 2017-09-08 ENCOUNTER — Emergency Department (HOSPITAL_COMMUNITY): Payer: Self-pay

## 2017-09-08 ENCOUNTER — Encounter (HOSPITAL_COMMUNITY): Payer: Self-pay | Admitting: Emergency Medicine

## 2017-09-08 DIAGNOSIS — N200 Calculus of kidney: Secondary | ICD-10-CM | POA: Insufficient documentation

## 2017-09-08 DIAGNOSIS — Z79899 Other long term (current) drug therapy: Secondary | ICD-10-CM | POA: Insufficient documentation

## 2017-09-08 LAB — COMPREHENSIVE METABOLIC PANEL
ALT: 15 U/L — ABNORMAL LOW (ref 17–63)
AST: 22 U/L (ref 15–41)
Albumin: 4.5 g/dL (ref 3.5–5.0)
Alkaline Phosphatase: 74 U/L (ref 38–126)
Anion gap: 10 (ref 5–15)
BUN: 5 mg/dL — ABNORMAL LOW (ref 6–20)
CO2: 27 mmol/L (ref 22–32)
Calcium: 9.4 mg/dL (ref 8.9–10.3)
Chloride: 102 mmol/L (ref 101–111)
Creatinine, Ser: 1 mg/dL (ref 0.61–1.24)
GFR calc Af Amer: >60 mL/min (ref >60)
GFR calc non Af Amer: >60 mL/min (ref >60)
Glucose, Bld: 88 mg/dL (ref 65–99)
Potassium: 3.9 mmol/L (ref 3.5–5.1)
Sodium: 139 mmol/L (ref 135–145)
Total Bilirubin: 1.1 mg/dL (ref 0.3–1.2)
Total Protein: 7.7 g/dL (ref 6.5–8.1)

## 2017-09-08 LAB — CBC WITH DIFFERENTIAL/PLATELET
Basophils Absolute: 0 10*3/uL (ref 0.0–0.1)
Basophils Relative: 0 %
Eosinophils Absolute: 0 10*3/uL (ref 0.0–0.7)
Eosinophils Relative: 0 %
HCT: 46.3 % (ref 39.0–52.0)
Hemoglobin: 15.7 g/dL (ref 13.0–17.0)
Lymphocytes Relative: 18 %
Lymphs Abs: 1.5 10*3/uL (ref 0.7–4.0)
MCH: 31.3 pg (ref 26.0–34.0)
MCHC: 33.9 g/dL (ref 30.0–36.0)
MCV: 92.4 fL (ref 78.0–100.0)
Monocytes Absolute: 0.4 10*3/uL (ref 0.1–1.0)
Monocytes Relative: 4 %
Neutro Abs: 6.5 10*3/uL (ref 1.7–7.7)
Neutrophils Relative %: 78 %
Platelets: 187 10*3/uL (ref 150–400)
RBC: 5.01 MIL/uL (ref 4.22–5.81)
RDW: 12.5 % (ref 11.5–15.5)
WBC: 8.4 10*3/uL (ref 4.0–10.5)

## 2017-09-08 LAB — URINALYSIS, ROUTINE W REFLEX MICROSCOPIC
BILIRUBIN URINE: NEGATIVE
Glucose, UA: NEGATIVE mg/dL
HGB URINE DIPSTICK: NEGATIVE
Ketones, ur: NEGATIVE mg/dL
Leukocytes, UA: NEGATIVE
Nitrite: NEGATIVE
Protein, ur: NEGATIVE mg/dL
Specific Gravity, Urine: 1.019 (ref 1.005–1.030)
pH: 5 (ref 5.0–8.0)

## 2017-09-08 LAB — LIPASE, BLOOD: Lipase: 25 U/L (ref 11–51)

## 2017-09-08 MED ORDER — OXYCODONE-ACETAMINOPHEN 5-325 MG PO TABS: 1.0000 | ORAL_TABLET | Freq: Four times a day (QID) | ORAL | 0 refills | Status: AC | PRN

## 2017-09-08 NOTE — Discharge Instructions (Addendum)
Please read attached information. If you experience any new or worsening signs or symptoms please return to the emergency room for evaluation. Please follow-up with your primary care provider or specialist as discussed. Please use medication prescribed only as directed and discontinue taking if you have any concerning signs or symptoms.   °

## 2017-09-08 NOTE — ED Notes (Signed)
Patient transported to CT 

## 2017-09-08 NOTE — ED Provider Notes (Signed)
Patient placed in Quick Look pathway, seen and evaluated   Chief Complaint: L flank pain, urinary urgency  HPI:   Patient presents to ED for evaluation of 2-week history of left-sided flank pain with associated urinary urgency.  No previous history of similar symptoms in the past.  Denies any hematuria, history of kidney stones, penile discharge, fevers, dysuria.  ROS: urinary urgency  Physical Exam:   Gen: No distress  Neuro: Awake and Alert  Skin: Warm    Focused Exam: No abdominal tenderness to palpation or CVA tenderness bilaterally.   Initiation of care has begun. The patient has been counseled on the process, plan, and necessity for staying for the completion/evaluation, and the remainder of the medical screening examination    Dietrich PatesKhatri, Nycere Presley, PA-C 09/08/17 1627    Raeford RazorKohut, Stephen, MD 09/10/17 1012

## 2017-09-08 NOTE — ED Provider Notes (Signed)
[facility] EMERGENCY DEPARTMENT Provider Note   CSN: [id number] Arrival date & time: 09/08/17  1602     History   Chief Complaint Chief Complaint  Patient presents with  . Urinary Tract Infection    HPI Jeff Ramos is a 37 y.o. male.  HPI   37 year old male presents today with complaints of left-sided abdomen and flank pain.  Patient notes symptoms started approximately 2 weeks ago with a dull ache in his left flank and lower abdomen.  Patient also notes a sensation that he cannot completely empty his bladder.  Patient denies any severe pain, denies any radiation of pain, denies any dysuria, penile discharge, testicular pain.  Patient denies any nausea vomiting or fever.  He denies any history of the same.   History reviewed. No pertinent past medical history.  There are no active problems to display for this patient.   History reviewed. No pertinent surgical history.      Home Medications    Prior to Admission medications   Medication Sig Start Date End Date Taking? Authorizing Provider  Multiple Vitamins-Minerals (MULTIVITAMIN WITH MINERALS) tablet Take 1 tablet by mouth daily.   Yes [provider]  benzonatate (TESSALON) 100 MG capsule Take 1 capsule (100 mg total) by mouth 3 (three) times daily as needed. Patient not taking: Reported on 09/08/2017 08/22/17   Petrucelli, Lelon Mast R, PA-C  fluticasone (FLONASE) 50 MCG/ACT nasal spray Place 1 spray into both nostrils daily. Patient not taking: Reported on 09/08/2017 08/22/17   Petrucelli, Pleas Koch, PA-C  oxyCODONE-acetaminophen (PERCOCET/ROXICET) 5-325 MG tablet Take 1 tablet by mouth every 6 (six) hours as needed for severe pain. 09/08/17   Eyvonne Mechanic, PA-C    Family History History reviewed. No pertinent family history.  Social History Social History   Tobacco Use  . Smoking status: Never Smoker  . Smokeless tobacco: Never Used  Substance Use Topics  . Alcohol use: No  . Drug  use: No     Allergies   Patient has no known allergies.   Review of Systems Review of Systems  All other systems reviewed and are negative.    Physical Exam Updated Vital Signs BP 118/86 (BP Location: Right Arm)   Pulse 60   Temp 98.8 F (37.1 C) (Oral)   Resp 16   SpO2 100%   Physical Exam  Constitutional: He is oriented to person, place, and time. He appears well-developed and well-nourished.  HENT:  Head: Normocephalic and atraumatic.  Eyes: Pupils are equal, round, and reactive to light. Conjunctivae are normal. Right eye exhibits no discharge. Left eye exhibits no discharge. No scleral icterus.  Neck: Normal range of motion. No JVD present. No tracheal deviation present.  Pulmonary/Chest: Effort normal. No stridor.  Abdominal: Soft. He exhibits no distension and no mass. There is no tenderness. There is no rebound and no guarding. No hernia.  Musculoskeletal:  Back nontender to palpation  Neurological: He is alert and oriented to person, place, and time. Coordination normal.  Psychiatric: He has a normal mood and affect. His behavior is normal. Judgment and thought content normal.  Nursing note and vitals reviewed.    ED Treatments / Results  Labs (all labs ordered are listed, but only abnormal results are displayed) Labs Reviewed  COMPREHENSIVE METABOLIC PANEL - Abnormal; Notable for the following components:      Result Value   BUN 5 (*)    ALT 15 (*)    All other components within normal limits  URINALYSIS, ROUTINE W REFLEX MICROSCOPIC  CBC WITH DIFFERENTIAL/PLATELET  LIPASE, BLOOD    EKG None  Radiology Ct Renal Stone Study  Result Date: 09/08/2017 CLINICAL DATA:  Two weeks of LEFT flank pain radiating to RIGHT flank with urinary urgency. EXAM: CT ABDOMEN AND PELVIS WITHOUT CONTRAST TECHNIQUE: Multidetector CT imaging of the abdomen and pelvis was performed following the standard protocol without IV contrast. COMPARISON:  None. FINDINGS: Lower  chest: No acute abnormality. Hepatobiliary: Probable small cyst within the RIGHT liver lobe, too small to definitively characterize. No suspicious lesion within the liver. Gallbladder appears normal. No bile duct dilatation. Pancreas: Unremarkable. No pancreatic ductal dilatation or surrounding inflammatory changes. Spleen: Normal in size without focal abnormality. Adrenals/Urinary Tract: 2 adjacent stones within the LEFT renal pelvis measuring 4 mm and 1 mm respectively no associated hydronephrosis. RIGHT kidney is unremarkable without stone or hydronephrosis. No perinephric inflammation bilaterally. 3 mm stone within the bladder. Stomach/Bowel: Bowel is normal in caliber. No bowel wall thickening or evidence of bowel wall inflammation seen. Appendix is normal. Stomach is unremarkable. Vascular/Lymphatic: No significant vascular findings are present. No enlarged abdominal or pelvic lymph nodes. Reproductive: Prostate is unremarkable. Other: No free fluid or abscess collection. No free intraperitoneal air. Musculoskeletal: No acute or suspicious osseous abnormality. IMPRESSION: 1. 3 mm stone within the dependent portion of the bladder, presumably recently passed via the LEFT ureter per clinical symptoms. No ureteral stone appreciated at this time. 2. Two LEFT renal stones, measuring 4 mm and 1 mm respectively. No associated hydronephrosis. Electronically Signed   By: Bary RichardStan  Maynard M.D.   On: 09/08/2017 19:41    Procedures Procedures (including critical care time)  Medications Ordered in ED Medications - No data to display   Initial Impression / Assessment and Plan / ED Course  I have reviewed the triage vital signs and the nursing notes.  Pertinent labs & imaging results that were available during my care of the patient were reviewed by me and considered in my medical decision making (see chart for details).     Labs: CBC, CMP, lipase, urinalysis  Imaging: CT  renal  Consults:  Therapeutics:  Discharge Meds: Percocet  Assessment/Plan: 37 year old male presents today with complaints of flank pain.  Patient notes severe episode of flank pain now a dull ache in his left flank.  Patient with likely recently passed 3 mm stone.  Patient also has a 4 mm and 1 mm stone in the left kidney.  He has no comp gating features including hydronephrosis, infection, or decreased kidney function.  No other abnormalities noted.  Patient will be discharged with a medication in the event of recurring symptoms, he will follow-up as an outpatient with urology return immediately with any concerning signs or symptoms.  He verbalized understanding and agreement to today's plan had no further questions or concerns at the time of discharge.   Final Clinical Impressions(s) / ED Diagnoses   Final diagnoses:  Kidney stones    ED Discharge Orders        Ordered    oxyCODONE-acetaminophen (PERCOCET/ROXICET) 5-325 MG tablet  Every 6 hours PRN     09/08/17 2118       Rosalio LoudHedges, Aydrien Froman, PA-C 09/08/17 2119    Loren RacerYelverton, David, MD 09/08/17 2144

## 2017-09-08 NOTE — ED Triage Notes (Signed)
Pt states 2 weeks of left flank pain that has radiated to the right flank with urinary urgency. Denies fevers or chills. No hx of kidney stones. Denies penile discharge or rash.

## 2017-09-16 ENCOUNTER — Encounter (HOSPITAL_COMMUNITY): Payer: Self-pay | Admitting: *Deleted

## 2017-09-16 ENCOUNTER — Emergency Department (HOSPITAL_COMMUNITY)
Admission: EM | Admit: 2017-09-16 | Discharge: 2017-09-16 | Disposition: A | Discharge status: Home / Self Care | Payer: Self-pay | Attending: Emergency Medicine | Admitting: Emergency Medicine

## 2017-09-16 ENCOUNTER — Emergency Department (HOSPITAL_COMMUNITY): Payer: Self-pay

## 2017-09-16 ENCOUNTER — Other Ambulatory Visit: Payer: Self-pay

## 2017-09-16 DIAGNOSIS — Z79899 Other long term (current) drug therapy: Secondary | ICD-10-CM | POA: Insufficient documentation

## 2017-09-16 DIAGNOSIS — M25461 Effusion, right knee: Secondary | ICD-10-CM | POA: Insufficient documentation

## 2017-09-16 MED ORDER — NAPROXEN 500 MG PO TABS: 500.0000 mg | ORAL_TABLET | Freq: Two times a day (BID) | ORAL | 0 refills | Status: AC

## 2017-09-16 NOTE — ED Notes (Signed)
Pt is alert and oriented x 4 and is verbally responsive. Pt reports that he has some swelling or water on the rt knee is noted. Pt reports that he had a kidney stone and had increased his H@O  intake lately. Pt denies any injury to the area. Area appears slightly swollen 1/10 pain ache x 5 day

## 2017-09-16 NOTE — ED Triage Notes (Signed)
Pt complains of pain and swelling in his right knee for the past 5 days. Pt denies injury.

## 2017-09-16 NOTE — ED Provider Notes (Signed)
[facility]-EMERGENCY DEPT Provider Note   CSN: [id number] Arrival date & time: 09/16/17  1354     History   Chief Complaint Chief Complaint  Patient presents with  . Joint Swelling    HPI Jeff Ramos is a 37 y.o. male who presents to the ED with c/o joint swelling of his right knee for the past 5 days. Patient reports that he is very active and exercises. Before the swelling started the patient had started doing jump rope. Patient reports that he is taking Aleve for the pain.   HPI  History reviewed. No pertinent past medical history.  There are no active problems to display for this patient.   History reviewed. No pertinent surgical history.      Home Medications    Prior to Admission medications   Medication Sig Start Date End Date Taking? Authorizing Provider  Multiple Vitamins-Minerals (MULTIVITAMIN WITH MINERALS) tablet Take 1 tablet by mouth daily.    [provider]  naproxen (NAPROSYN) 500 MG tablet Take 1 tablet (500 mg total) by mouth 2 (two) times daily. 09/16/17   Janne Napoleon, NP  oxyCODONE-acetaminophen (PERCOCET/ROXICET) 5-325 MG tablet Take 1 tablet by mouth every 6 (six) hours as needed for severe pain. 09/08/17   Eyvonne Mechanic, PA-C    Family History No family history on file.  Social History Social History   Tobacco Use  . Smoking status: Never Smoker  . Smokeless tobacco: Never Used  Substance Use Topics  . Alcohol use: No  . Drug use: No     Allergies   Patient has no known allergies.   Review of Systems Review of Systems  Musculoskeletal: Positive for arthralgias and joint swelling.  All other systems reviewed and are negative.    Physical Exam Updated Vital Signs BP (!) 142/78 (BP Location: Left Arm)   Pulse 85   Temp 98.9 F (37.2 C) (Oral)   Resp 16   Ht 5\' 11"  (1.803 m)   Wt 74.4 kg (164 lb)   SpO2 100%   BMI 22.87 kg/m   Physical Exam  Constitutional: He is oriented to person,  place, and time. He appears well-developed and well-nourished. No distress.  HENT:  Head: Normocephalic.  Eyes: EOM are normal.  Neck: Neck supple.  Cardiovascular: Normal rate and intact distal pulses.  Pulmonary/Chest: Effort normal.  Musculoskeletal:       Right knee: He exhibits swelling. He exhibits normal range of motion, no ecchymosis, no deformity, no laceration, no erythema and normal alignment. Tenderness (minimal) found.  Neurological: He is alert and oriented to person, place, and time. No cranial nerve deficit.  Skin: Skin is warm and dry.  Psychiatric: He has a normal mood and affect.  Nursing note and vitals reviewed.    ED Treatments / Results  Labs (all labs ordered are listed, but only abnormal results are displayed) Labs Reviewed - No data to display Radiology Dg Knee Complete 4 Views Right  Result Date: 09/16/2017 CLINICAL DATA:  Right upper knee pain and swelling. EXAM: RIGHT KNEE - COMPLETE 4+ VIEW COMPARISON:  None. FINDINGS: No fracture, dislocation or bony lesion identified. No significant arthropathy is seen. In the lateral projection, there may be a small amount of joint fluid in the suprapatellar region. IMPRESSION: Potential small suprapatellar joint effusion of the right knee. No underlying bony pathology or arthropathy seen. Electronically Signed   By: Irish Lack M.D.   On: 09/16/2017 16:33    Procedures Procedures (including  critical care time)  Medications Ordered in ED Medications - No data to display   Initial Impression / Assessment and Plan / ED Course  I have reviewed the triage vital signs and the nursing notes. 37 y.o. male with swelling to the right knee after jumping rope for exercise stable for d/c without fracture or dislocation noted on x-ray and no signs of compartment sydrome. Patient does have a small joint effusion. Knee sleeve applied, crutches, ice and elevation, NSAID and f/u with PCP. Patient agrees with plan.   Final  Clinical Impressions(s) / ED Diagnoses   Final diagnoses:  Knee effusion, right    ED Discharge Orders        Ordered    naproxen (NAPROSYN) 500 MG tablet  2 times daily     09/16/17 1642       Damian Leavelleese, ChalmersHope M, [place]NP 09/16/17 2046    Doug SouJacubowitz, Sam, MD 09/17/17 [phone]

## 2019-03-30 ENCOUNTER — Other Ambulatory Visit: Payer: Self-pay

## 2019-03-30 ENCOUNTER — Encounter (HOSPITAL_COMMUNITY): Payer: Self-pay

## 2019-03-30 ENCOUNTER — Emergency Department (HOSPITAL_COMMUNITY)
Admission: EM | Admit: 2019-03-30 | Discharge: 2019-03-30 | Disposition: A | Discharge status: Home / Self Care | Payer: Self-pay | Attending: Emergency Medicine | Admitting: Emergency Medicine

## 2019-03-30 DIAGNOSIS — R369 Urethral discharge, unspecified: Secondary | ICD-10-CM | POA: Insufficient documentation

## 2019-03-30 LAB — URINALYSIS, ROUTINE W REFLEX MICROSCOPIC
Bilirubin Urine: NEGATIVE
Glucose, UA: NEGATIVE mg/dL
Hgb urine dipstick: NEGATIVE
Ketones, ur: NEGATIVE mg/dL
Leukocytes,Ua: NEGATIVE
Nitrite: NEGATIVE
Protein, ur: NEGATIVE mg/dL
Specific Gravity, Urine: 1.024 (ref 1.005–1.030)
pH: 7 (ref 5.0–8.0)

## 2019-03-30 MED ORDER — AZITHROMYCIN 250 MG PO TABS
1000.0000 mg | ORAL_TABLET | Freq: Once | ORAL | Status: AC
  Administered 2019-03-30: 1000 mg via ORAL
  Filled 2019-03-30: qty 4

## 2019-03-30 MED ORDER — CEFTRIAXONE SODIUM 250 MG IJ SOLR
250.0000 mg | Freq: Once | INTRAMUSCULAR | Status: AC
  Administered 2019-03-30: 19:00:00 250 mg via INTRAMUSCULAR
  Filled 2019-03-30: qty 250

## 2019-03-30 MED ORDER — LIDOCAINE HCL (PF) 1 % IJ SOLN
INTRAMUSCULAR | Status: AC
  Administered 2019-03-30: 19:00:00 1 mL
  Filled 2019-03-30: qty 5

## 2019-03-30 NOTE — ED Triage Notes (Signed)
Pt states he noticed he was having penile discharge that started 4 days ago. Pt denies N/V and burning during urination. Pt also denies any pain.

## 2019-03-30 NOTE — ED Provider Notes (Signed)
[facility] EMERGENCY DEPARTMENT Provider Note   CSN: [id number] Arrival date & time: 03/30/19  1512     History   Chief Complaint Chief Complaint  Patient presents with  . Penile Discharge    HPI Jeff Ramos is a 38 y.o. male presents to the ER for "wetness" in his genital area.  Provides more detail and states that there is fluid coming out of his penis.  It is watery and clear.  Onset 4 days ago.  Has no other associated symptoms including fever, abdominal pain, testicular pain, genital lesions.  He is sexually active with both male and male partners.  Recently involved in receptive oral intercourse without condom use.  Reports in the past he has had similar symptoms and was giving medicine for an infection.  No modifying factors.  No interventions.  He denies any anal intercourse and has no anal discomfort, discharge, diarrhea.     HPI  History reviewed. No pertinent past medical history.  There are no active problems to display for this patient.   History reviewed. No pertinent surgical history.      Home Medications    Prior to Admission medications   Medication Sig Start Date End Date Taking? Authorizing Provider  Multiple Vitamins-Minerals (MULTIVITAMIN WITH MINERALS) tablet Take 1 tablet by mouth daily.    [provider]  naproxen (NAPROSYN) 500 MG tablet Take 1 tablet (500 mg total) by mouth 2 (two) times daily. 09/16/17   Janne Napoleon, NP  oxyCODONE-acetaminophen (PERCOCET/ROXICET) 5-325 MG tablet Take 1 tablet by mouth every 6 (six) hours as needed for severe pain. 09/08/17   Eyvonne Mechanic, PA-C    Family History History reviewed. No pertinent family history.  Social History Social History   Tobacco Use  . Smoking status: Never Smoker  . Smokeless tobacco: Never Used  Substance Use Topics  . Alcohol use: No  . Drug use: No     Allergies   Patient has no known allergies.   Review of Systems Review of Systems   Genitourinary: Positive for discharge.  All other systems reviewed and are negative.    Physical Exam Updated Vital Signs BP 119/80   Pulse 74   Temp 97.9 F (36.6 C) (Oral)   Resp 16   Ht 5\' 10"  (1.778 m)   Wt 75.3 kg   SpO2 99%   BMI 23.82 kg/m   Physical Exam Vitals signs and nursing note reviewed.  Constitutional:      Appearance: He is well-developed.     Comments: Non toxic.  HENT:     Head: Normocephalic and atraumatic.     Nose: Nose normal.  Eyes:     Conjunctiva/sclera: Conjunctivae normal.  Neck:     Musculoskeletal: Normal range of motion.  Cardiovascular:     Rate and Rhythm: Normal rate and regular rhythm.     Heart sounds: Normal heart sounds.  Pulmonary:     Effort: Pulmonary effort is normal.     Breath sounds: Normal breath sounds.  Abdominal:     General: Bowel sounds are normal.     Palpations: Abdomen is soft.     Tenderness: There is no abdominal tenderness.     Comments: No G/R/R. No suprapubic or CVA tenderness. Negative Murphy's and McBurney's  Genitourinary:    Penis: Discharge present.      Comments: Exam performed with RN at bedside.  Clear watery discharge dripping from urethra.  External genitalia normal without erythema, edema, tenderness  or lesions. Circumcised male. No groin lymphadenopathy. Glans and shaft smooth without tenderness, lesions, masses or deformity. Scrotum without lesions or edema. Non tender testicles. Epididymis and spermatic cord without tenderness or masses, bilaterally. Cremasteric reflex intact. Musculoskeletal: Normal range of motion.  Skin:    General: Skin is warm and dry.     Capillary Refill: Capillary refill takes less than 2 seconds.  Neurological:     Mental Status: He is alert.  Psychiatric:        Behavior: Behavior normal.      ED Treatments / Results  Labs (all labs ordered are listed, but only abnormal results are displayed) Labs Reviewed  URINALYSIS, ROUTINE W REFLEX MICROSCOPIC -  Abnormal; Notable for the following components:      Result Value   APPearance HAZY (*)    All other components within normal limits  URINE CULTURE  GC/CHLAMYDIA PROBE AMP ([place]) NOT AT [facility]    EKG None  Radiology No results found.  Procedures Procedures (including critical care time)  Medications Ordered in ED Medications  cefTRIAXone (ROCEPHIN) injection 250 mg (250 mg Intramuscular Given 03/30/19 1852)  azithromycin (ZITHROMAX) tablet 1,000 mg (1,000 mg Oral Given 03/30/19 1851)  lidocaine (PF) (XYLOCAINE) 1 % injection (1 mL  Given 03/30/19 1852)     Initial Impression / Assessment and Plan / ED Course  I have reviewed the triage vital signs and the nursing notes.  Pertinent labs & imaging results that were available during my care of the patient were reviewed by me and considered in my medical decision making (see chart for details).   38 year old male with previous history of STDs and urethritis here for penile discharge.  Exam as above, otherwise reassuring.  No genital lesions, fever, lower abdominal tenderness.  Given high risk sexual practices concern for STD which is highest on differential.  No signs or symptoms to suggest epididymitis, torsion, systemic illness.  Patient empirically treated with Rocephin and azithromycin here.  UA is negative.  Provided STD education.  Encouraged pt to notify partners for testing and treatment. Return precautions given.   Final Clinical Impressions(s) / ED Diagnoses   Final diagnoses:  Penile discharge    ED Discharge Orders    None       Arlean Hopping 03/30/19 1942    Quintella Reichert, MD 03/31/19 [phone]

## 2019-03-30 NOTE — Discharge Instructions (Addendum)
You were seen in the ER for discharge from penis.  Urinalysis was normal.  Gonorrhea and Chlamydia testing are pending.  Given your possible exposure and symptoms we treated you with Rocephin and azithromycin which are treatments for gonorrhea and chlamydia.  Your symptoms should improve over the next few days.  Return to the ER for lesions on the genitals, fevers, abdominal pain, testicular pain, difficulty urinating  Avoid sexual encounters for the next 7 to 10 days after treatment.  Notify all your sexual partners that you are having symptoms of potential STDs.  They need to be tested and treated.

## 2019-03-31 LAB — URINE CULTURE: Culture: NO GROWTH

## 2019-04-03 LAB — GC/CHLAMYDIA PROBE AMP (~~LOC~~) NOT AT ARMC
Chlamydia: NEGATIVE
Neisseria Gonorrhea: NEGATIVE

## 2019-06-12 ENCOUNTER — Other Ambulatory Visit: Payer: Self-pay

## 2019-06-12 ENCOUNTER — Encounter (HOSPITAL_COMMUNITY): Payer: Self-pay | Admitting: Emergency Medicine

## 2019-06-12 ENCOUNTER — Emergency Department (HOSPITAL_COMMUNITY)
Admission: EM | Admit: 2019-06-12 | Discharge: 2019-06-12 | Disposition: A | Discharge status: Home / Self Care | Payer: Self-pay | Attending: Emergency Medicine | Admitting: Emergency Medicine

## 2019-06-12 DIAGNOSIS — Z202 Contact with and (suspected) exposure to infections with a predominantly sexual mode of transmission: Secondary | ICD-10-CM | POA: Insufficient documentation

## 2019-06-12 DIAGNOSIS — Z711 Person with feared health complaint in whom no diagnosis is made: Secondary | ICD-10-CM

## 2019-06-12 DIAGNOSIS — R369 Urethral discharge, unspecified: Secondary | ICD-10-CM | POA: Insufficient documentation

## 2019-06-12 DIAGNOSIS — Z79899 Other long term (current) drug therapy: Secondary | ICD-10-CM | POA: Insufficient documentation

## 2019-06-12 DIAGNOSIS — L293 Anogenital pruritus, unspecified: Secondary | ICD-10-CM | POA: Insufficient documentation

## 2019-06-12 LAB — URINALYSIS, ROUTINE W REFLEX MICROSCOPIC
Bacteria, UA: NONE SEEN
Bilirubin Urine: NEGATIVE
Glucose, UA: NEGATIVE mg/dL
Hgb urine dipstick: NEGATIVE
Ketones, ur: 5 mg/dL — AB
Nitrite: NEGATIVE
Protein, ur: NEGATIVE mg/dL
Specific Gravity, Urine: 1.029 (ref 1.005–1.030)
pH: 5 (ref 5.0–8.0)

## 2019-06-12 MED ORDER — DOXYCYCLINE HYCLATE 100 MG PO TABS
100.0000 mg | ORAL_TABLET | Freq: Once | ORAL | Status: AC
  Administered 2019-06-12: 100 mg via ORAL
  Filled 2019-06-12: qty 1

## 2019-06-12 MED ORDER — DOXYCYCLINE HYCLATE 100 MG PO CAPS: 100.0000 mg | ORAL_CAPSULE | Freq: Two times a day (BID) | ORAL | 0 refills | Status: AC

## 2019-06-12 MED ORDER — CEFTRIAXONE SODIUM 1 G IJ SOLR
500.0000 mg | Freq: Once | INTRAMUSCULAR | Status: AC
  Administered 2019-06-12: 16:00:00 500 mg via INTRAMUSCULAR
  Filled 2019-06-12: qty 10

## 2019-06-12 NOTE — ED Provider Notes (Signed)
[facility]-EMER[facility] DEPT Provider Note   CSN: [id number] Arrival date & time: 06/12/19  1459     History Chief Complaint  Patient presents with  . Penile Discharge    Jeff Ramos is a 39 y.o. male who presents for evaluation of penile discharge that is ongoing for the last few days.  He describes as a milky white discharge.  He states he has had some itching but denies any dysuria.  He has not had any rash.  He states that he did engage in oral sex prior to onset of symptoms.  He does not use any protection.  He does have both male and male partners.  Denies any fevers, dysuria, hematuria, testicular pain or swelling.  The history is provided by the patient.       History reviewed. No pertinent past medical history.  There are no problems to display for this patient.   History reviewed. No pertinent surgical history.     No family history on file.  Social History   Tobacco Use  . Smoking status: Never Smoker  . Smokeless tobacco: Never Used  Substance Use Topics  . Alcohol use: No  . Drug use: No    Home Medications Prior to Admission medications   Medication Sig Start Date End Date Taking? Authorizing Provider  doxycycline (VIBRAMYCIN) 100 MG capsule Take 1 capsule (100 mg total) by mouth 2 (two) times daily for 7 days. 06/12/19 06/19/19  Maxwell Caul, PA-C  Multiple Vitamins-Minerals (MULTIVITAMIN WITH MINERALS) tablet Take 1 tablet by mouth daily.    [provider]  naproxen (NAPROSYN) 500 MG tablet Take 1 tablet (500 mg total) by mouth 2 (two) times daily. 09/16/17   Janne Napoleon, NP  oxyCODONE-acetaminophen (PERCOCET/ROXICET) 5-325 MG tablet Take 1 tablet by mouth every 6 (six) hours as needed for severe pain. 09/08/17   Eyvonne Mechanic, PA-C    Allergies    Patient has no known allergies.  Review of Systems   Review of Systems  Constitutional: Negative for fever.  Gastrointestinal: Negative for abdominal pain,  nausea and vomiting.  Genitourinary: Positive for discharge. Negative for dysuria, hematuria, penile pain, penile swelling, scrotal swelling and testicular pain.  All other systems reviewed and are negative.   Physical Exam Updated Vital Signs BP 131/74 (BP Location: Left Arm)   Pulse 84   Temp 98.6 F (37 C) (Oral)   Resp 18   SpO2 100%   Physical Exam Vitals and nursing note reviewed. Exam conducted with a chaperone present.  Constitutional:      Appearance: He is well-developed.  HENT:     Head: Normocephalic and atraumatic.  Eyes:     General: No scleral icterus.       Right eye: No discharge.        Left eye: No discharge.     Conjunctiva/sclera: Conjunctivae normal.  Pulmonary:     Effort: Pulmonary effort is normal.  Genitourinary:    Comments: The exam was performed with a chaperone present. Normal male genitalia. No evidence of rash, ulcers or lesions.  No tenderness palpation of the bilateral testicles.  No overlying warmth, erythema.  No rash noted to the penis.  Skin:    General: Skin is warm and dry.  Neurological:     Mental Status: He is alert.  Psychiatric:        Speech: Speech normal.        Behavior: Behavior normal.     ED Results /  Procedures / Treatments   Labs (all labs ordered are listed, but only abnormal results are displayed) Labs Reviewed  URINALYSIS, ROUTINE W REFLEX MICROSCOPIC - Abnormal; Notable for the following components:      Result Value   Ketones, ur 5 (*)    Leukocytes,Ua SMALL (*)    All other components within normal limits  GC/CHLAMYDIA PROBE AMP ([place]) NOT AT [facility]    EKG None  Radiology No results found.  Procedures Procedures (including critical care time)  Medications Ordered in ED Medications  cefTRIAXone (ROCEPHIN) injection 500 mg (500 mg Intramuscular Given 06/12/19 1605)  doxycycline (VIBRA-TABS) tablet 100 mg (100 mg Oral Given 06/12/19 1606)    ED Course  I have reviewed the triage vital signs  and the nursing notes.  Pertinent labs & imaging results that were available during my care of the patient were reviewed by me and considered in my medical decision making (see chart for details).    MDM Rules/Calculators/A&P                      39 y.o. M who presents for evaluation of penile discharge. Patient is afebrile, non-toxic appearing, sitting comfortably on examination table. Vital signs reviewed and stable. GU exam shows no abnormalities.  No tenderness to palpation of the testes or epididymis to suggest orchitis or epididymitis.  STD cultures obtained and pending. Discussed with patient treatment options including treatment today or waiting until cultures returned for treatment.  Patient wishes to have treatment today.   Discussed importance of using protection when sexually active. Pt understands that they have GC/Chlamydia cultures pending and that they will need to inform all sexual partners if results return positive. Patient has been treated prophylactically .  Urine with small leukocytes.  Otherwise unremarkable.  Patient had ample opportunity for questions and discussion. All patient's questions were answered with full understanding. Strict return precautions discussed. Patient expresses understanding and agreement to plan.   Portions of this note were generated with Lobby[employer]. Dictation errors may occur despite best attempts at proofreading.   Final Clinical Impression(s) / ED Diagnoses Final diagnoses:  Concern about STD in male without diagnosis    Rx / DC Orders ED Discharge Orders         Ordered    doxycycline (VIBRAMYCIN) 100 MG capsule  2 times daily     06/12/19 1608           Volanda Napoleon, PA-C 06/12/19 1609    Lennice Sites, DO 06/12/19 1623

## 2019-06-12 NOTE — ED Triage Notes (Signed)
Pt reports that he had penile milky, yellow discharge and itching for 3-4 days.

## 2019-06-12 NOTE — Discharge Instructions (Signed)
You have been treated today for an STD.   Take antibiotics as directed. Please take all of your antibiotics until finished.  The test results with take 2-3 days to return. If there is an abnormal result, you will be notified. If you do not hear anything, that means the results were negative. You can also log on MyChart to see the results.   Your sexual partner needs to be treated too. Do not have sexual intercourse for the next 7 days and after your partner has been treated.   Follow-up with your primary care doctor in 2-4 days. If you do not have a primary care doctor, you can use one listed in the paperwork.   Return to the Emergency Department for any fever, abdominal pain, difficulty breathing, nausea/vomiting or any other worsening or concerning symptoms.   

## 2019-06-13 LAB — GC/CHLAMYDIA PROBE AMP (~~LOC~~) NOT AT ARMC
Chlamydia: NEGATIVE
Neisseria Gonorrhea: POSITIVE — AB

## 2019-07-11 ENCOUNTER — Other Ambulatory Visit: Payer: Self-pay

## 2019-07-11 ENCOUNTER — Emergency Department (HOSPITAL_COMMUNITY)
Admission: EM | Admit: 2019-07-11 | Discharge: 2019-07-11 | Disposition: A | Discharge status: Home / Self Care | Payer: Self-pay | Attending: Emergency Medicine | Admitting: Emergency Medicine

## 2019-07-11 ENCOUNTER — Encounter (HOSPITAL_COMMUNITY): Payer: Self-pay | Admitting: Emergency Medicine

## 2019-07-11 DIAGNOSIS — Z8619 Personal history of other infectious and parasitic diseases: Secondary | ICD-10-CM | POA: Insufficient documentation

## 2019-07-11 DIAGNOSIS — R300 Dysuria: Secondary | ICD-10-CM | POA: Insufficient documentation

## 2019-07-11 DIAGNOSIS — Z79899 Other long term (current) drug therapy: Secondary | ICD-10-CM | POA: Insufficient documentation

## 2019-07-11 LAB — HIV ANTIBODY (ROUTINE TESTING W REFLEX): HIV Screen 4th Generation wRfx: NONREACTIVE

## 2019-07-11 MED ORDER — CEFTRIAXONE SODIUM 1 G IJ SOLR
1.0000 g | Freq: Once | INTRAMUSCULAR | Status: AC
  Administered 2019-07-11: 17:00:00 1 g via INTRAMUSCULAR
  Filled 2019-07-11: qty 10

## 2019-07-11 MED ORDER — METRONIDAZOLE 500 MG PO TABS
2000.0000 mg | ORAL_TABLET | Freq: Once | ORAL | Status: AC
  Administered 2019-07-11: 17:00:00 2000 mg via ORAL
  Filled 2019-07-11: qty 4

## 2019-07-11 MED ORDER — STERILE WATER FOR INJECTION IJ SOLN
INTRAMUSCULAR | Status: AC
  Administered 2019-07-11: 17:00:00 10 mL
  Filled 2019-07-11: qty 10

## 2019-07-11 NOTE — ED Provider Notes (Signed)
[facility]-EMER[facility] DEPT Provider Note   CSN: [id number] Arrival date & time: 07/11/19  1520     History Chief Complaint  Patient presents with  . std follow up    Jeff Ramos is a 39 y.o. male.  39 yo M with a cc of penile itching and dysuria and discharge.  This been going on for about a month or so.  Patient was seen in the ED and was treated presumptively for STDs and came back with a positive gonorrhea test.  Patient states that he never had resolution of his symptoms.  Feels that he got mildly better but has persisted.  He denies rash denies testicular pain.  Denies any sexual contact after being treated.  States that he does have sex with both men and women.  The history is provided by the patient.  Illness Severity:  Moderate Onset quality:  Gradual Duration:  1 week Timing:  Constant Progression:  Worsening Chronicity:  New Associated symptoms: no abdominal pain, no chest pain, no congestion, no diarrhea, no fever, no headaches, no myalgias, no rash, no shortness of breath and no vomiting        History reviewed. No pertinent past medical history.  There are no problems to display for this patient.   History reviewed. No pertinent surgical history.     No family history on file.  Social History   Tobacco Use  . Smoking status: Never Smoker  . Smokeless tobacco: Never Used  Substance Use Topics  . Alcohol use: No  . Drug use: No    Home Medications Prior to Admission medications   Medication Sig Start Date End Date Taking? Authorizing Provider  Multiple Vitamins-Minerals (MULTIVITAMIN WITH MINERALS) tablet Take 1 tablet by mouth daily.    [provider]  naproxen (NAPROSYN) 500 MG tablet Take 1 tablet (500 mg total) by mouth 2 (two) times daily. 09/16/17   Janne Napoleon, NP  oxyCODONE-acetaminophen (PERCOCET/ROXICET) 5-325 MG tablet Take 1 tablet by mouth every 6 (six) hours as needed for severe pain. 09/08/17   Eyvonne Mechanic, PA-C    Allergies    Patient has no known allergies.  Review of Systems   Review of Systems  Constitutional: Negative for chills and fever.  HENT: Negative for congestion and facial swelling.   Eyes: Negative for discharge and visual disturbance.  Respiratory: Negative for shortness of breath.   Cardiovascular: Negative for chest pain and palpitations.  Gastrointestinal: Negative for abdominal pain, diarrhea and vomiting.  Genitourinary: Positive for discharge and dysuria.  Musculoskeletal: Negative for arthralgias and myalgias.  Skin: Negative for color change and rash.  Neurological: Negative for tremors, syncope and headaches.  Psychiatric/Behavioral: Negative for confusion and dysphoric mood.    Physical Exam Updated Vital Signs BP (!) 149/96 (BP Location: Left Arm)   Pulse 74   Temp 98.6 F (37 C) (Oral)   Resp 18   SpO2 100%   Physical Exam Vitals and nursing note reviewed.  Constitutional:      Appearance: He is well-developed.  HENT:     Head: Normocephalic and atraumatic.  Eyes:     Pupils: Pupils are equal, round, and reactive to light.  Neck:     Vascular: No JVD.  Cardiovascular:     Rate and Rhythm: Normal rate and regular rhythm.     Heart sounds: No murmur. No friction rub. No gallop.   Pulmonary:     Effort: No respiratory distress.  Breath sounds: No wheezing.  Abdominal:     General: There is no distension.     Tenderness: There is no guarding or rebound.  Genitourinary:    Comments: Circumcised, no rash, no discharge Musculoskeletal:        General: Normal range of motion.     Cervical back: Normal range of motion and neck supple.  Skin:    Coloration: Skin is not pale.     Findings: No rash.  Neurological:     Mental Status: He is alert and oriented to person, place, and time.  Psychiatric:        Behavior: Behavior normal.     ED Results / Procedures / Treatments   Labs (all labs ordered are listed, but only abnormal  results are displayed) Labs Reviewed  RPR  HIV ANTIBODY (ROUTINE TESTING W REFLEX)  GC/CHLAMYDIA PROBE AMP ([place]) NOT AT [facility]    EKG None  Radiology No results found.  Procedures Procedures (including critical care time)  Medications Ordered in ED Medications  cefTRIAXone (ROCEPHIN) injection 1 g (has no administration in time range)  metroNIDAZOLE (FLAGYL) tablet 2,000 mg (has no administration in time range)    ED Course  I have reviewed the triage vital signs and the nursing notes.  Pertinent labs & imaging results that were available during my care of the patient were reviewed by me and considered in my medical decision making (see chart for details).    MDM Rules/Calculators/A&P                      39 yo M with a chief complaints of dysuria.  Persistent after being treated for gonorrhea.  Will treat with 1 g of IM Rocephin, send off recurrent test.  If this test is positive then this needs to be reported to the [facility] or the health department for resistance gonorrhea.  As the patient does have sex with men I will refer him to ID.  Send off HIV and syphilis here as well.  Also treat for trichomonas.  4:03 PM:  I have discussed the diagnosis/risks/treatment options with the patient and believe the pt to be eligible for discharge home to follow-up with PCP. We also discussed returning to the ED immediately if new or worsening sx occur. We discussed the sx which are most concerning (e.g., sudden worsening pain, fever, inability to tolerate by mouth) that necessitate immediate return. Medications administered to the patient during their visit and any new prescriptions provided to the patient are listed below.  Medications given during this visit Medications  cefTRIAXone (ROCEPHIN) injection 1 g (has no administration in time range)  metroNIDAZOLE (FLAGYL) tablet 2,000 mg (has no administration in time range)     The patient appears reasonably screen and/or stabilized  for discharge and I doubt any other medical condition or other [facility] requiring further screening, evaluation, or treatment in the ED at this time prior to discharge.   Final Clinical Impression(s) / ED Diagnoses Final diagnoses:  Dysuria    Rx / DC Orders ED Discharge Orders    None       Deno Etienne, DO 07/11/19 1603

## 2019-07-11 NOTE — ED Triage Notes (Signed)
Patient states he was treated for STD on 1/19-states he doesn't think he got the correct medication to treat his symptoms-completed his antibiotics last Friday-states "it" still feels weird, itching

## 2019-07-11 NOTE — Discharge Instructions (Signed)
If your test is positive you should be called and see how you are doing.  Follow up with ID in the office.  Refrain from sexual contact for at least a week, and longer if your symptoms continue.

## 2019-07-11 NOTE — Progress Notes (Addendum)
TOC CM spoke to pt at bedside. Provided information and brochure on [facility]. Pt will be able to follow up for counseling. States he has been to [facility] in the past. States he works part-time and will be able to get medications. Isidoro Donning RN CCM, [facility] ED TOC CM [phone]

## 2019-07-12 LAB — RPR: RPR Ser Ql: NONREACTIVE

## 2019-07-13 LAB — GC/CHLAMYDIA PROBE AMP (~~LOC~~) NOT AT ARMC
Chlamydia: NEGATIVE
Neisseria Gonorrhea: NEGATIVE

## 2019-11-08 IMAGING — CR DG CHEST 2V
2 series · 2 of 2 positions shown · non-contrast
Comparison: None.

CLINICAL DATA: Cough

EXAM:
CHEST - 2 VIEW

[chest pa]
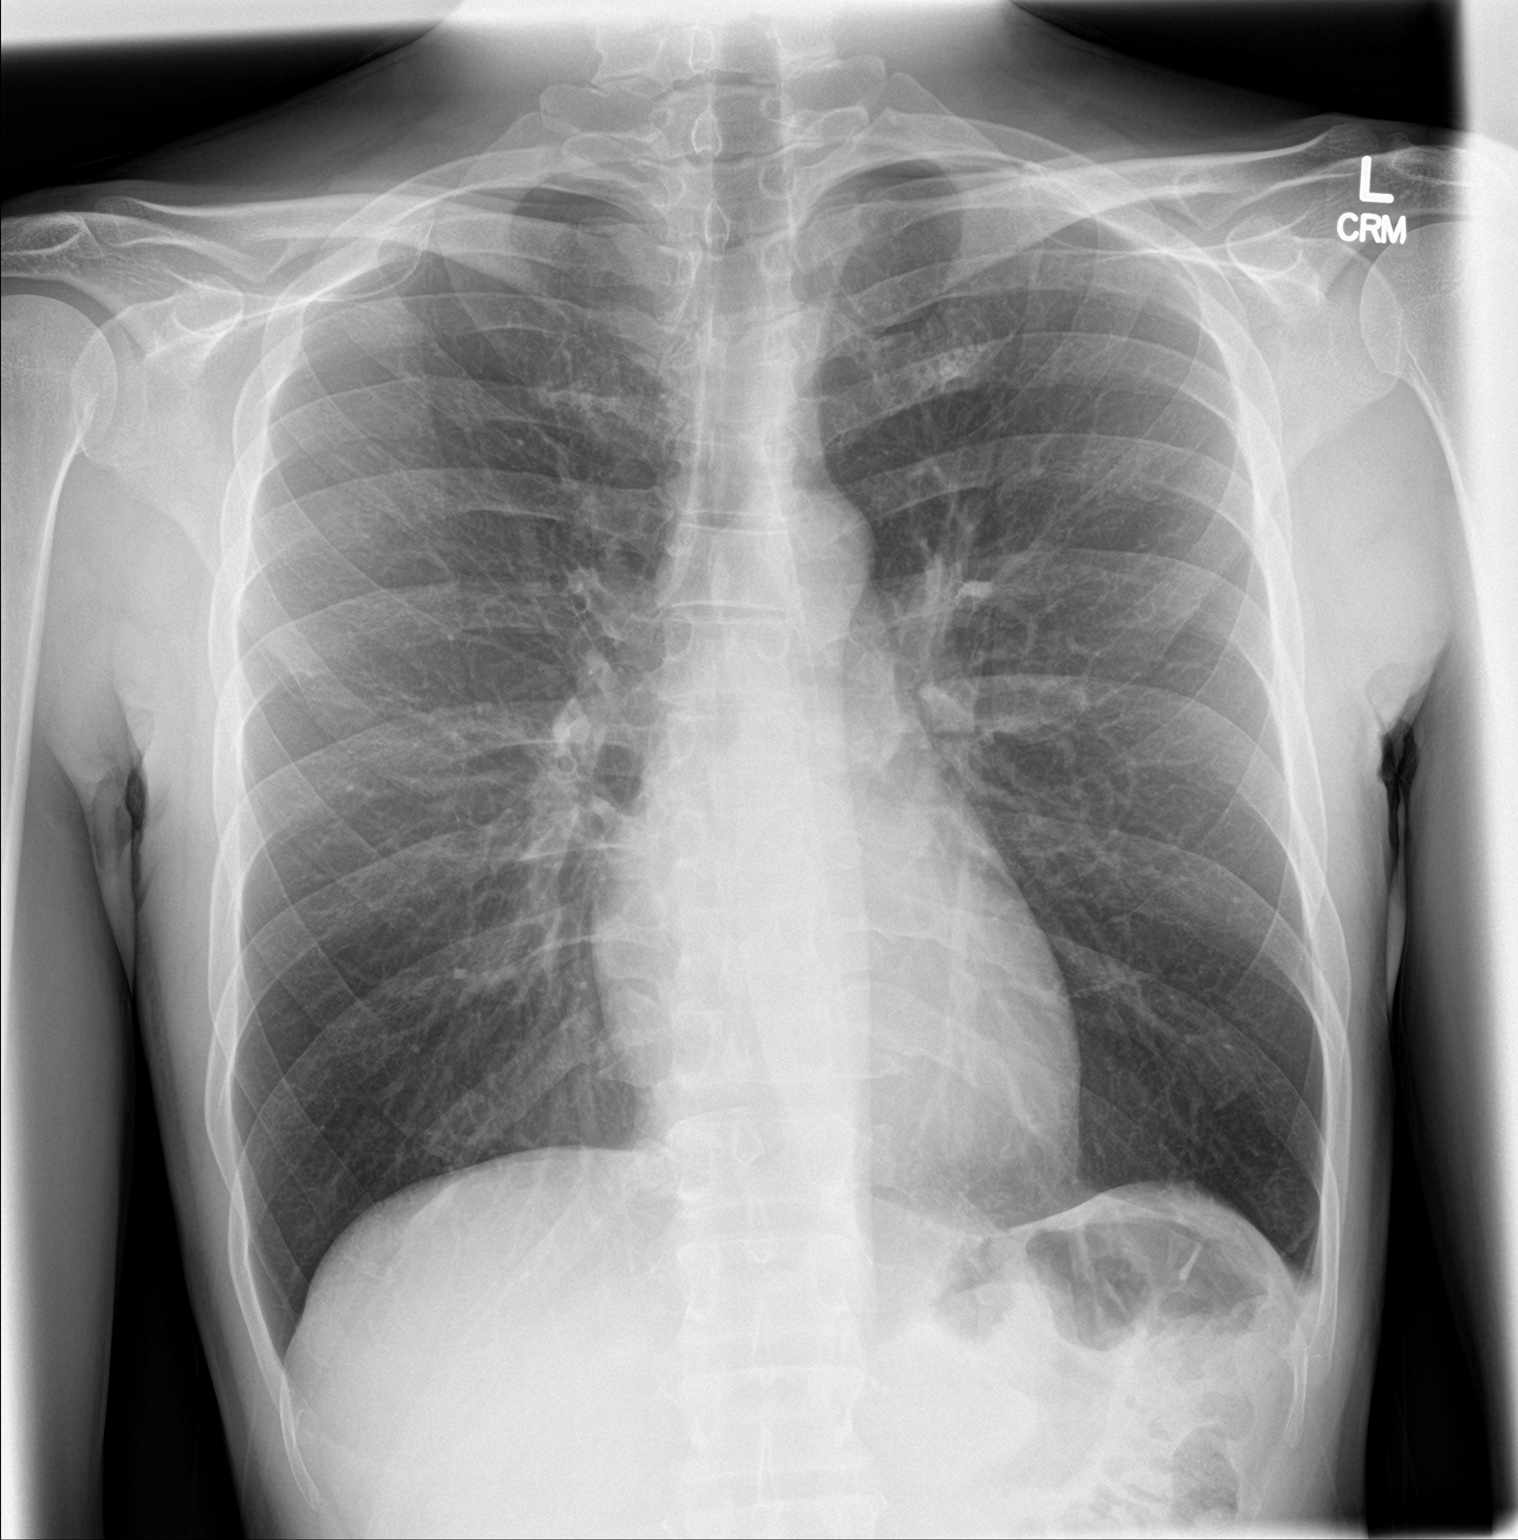

[chest lat]
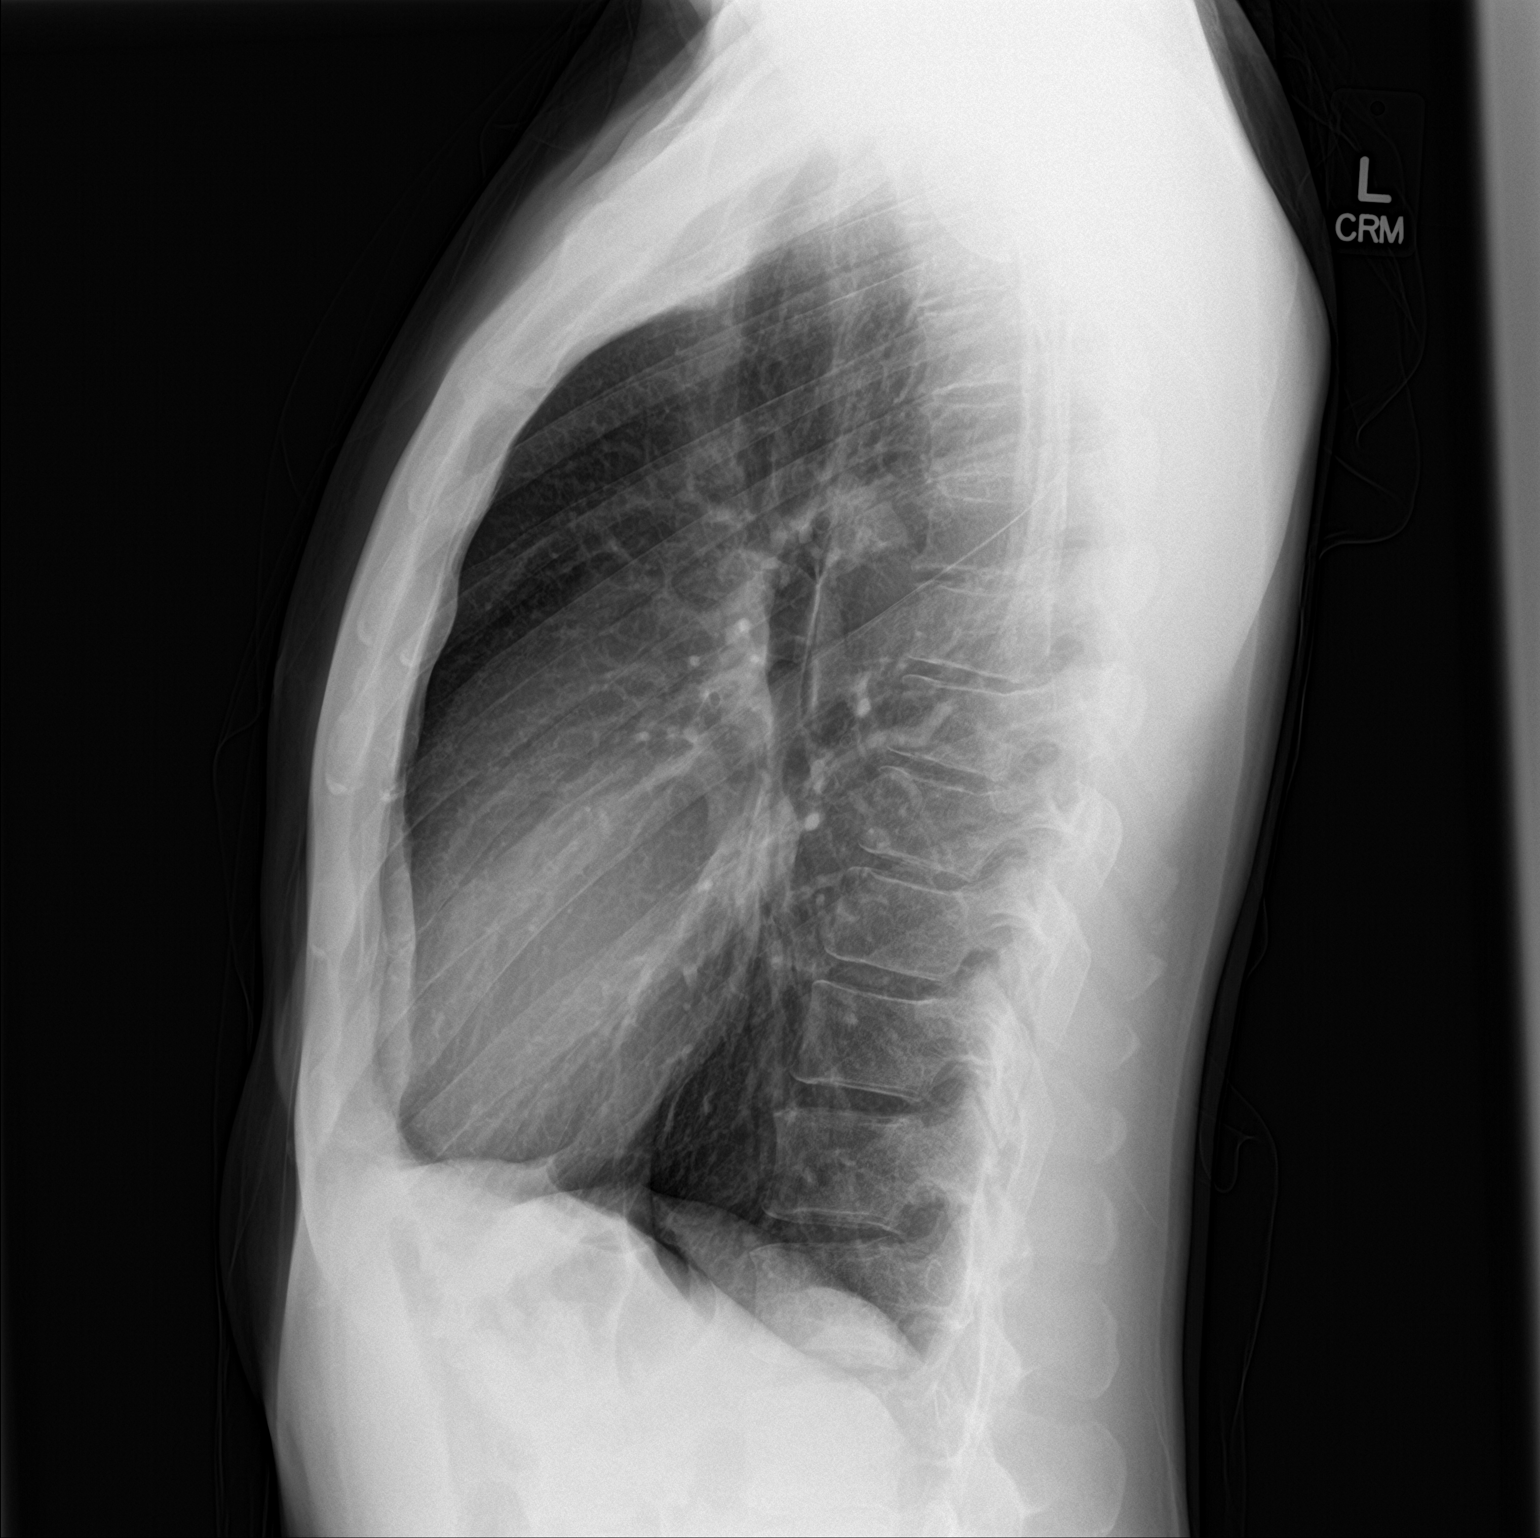

[2 of 2 positions shown; findings below may reference images not displayed]

FINDINGS: Hyperinflation. No focal opacity or pleural effusion. Normal heart
size. No pneumothorax.
IMPRESSION: No active cardiopulmonary disease.

## 2019-11-25 IMAGING — CT CT RENAL STONE PROTOCOL
2 of 4 series · 16 of 46 positions shown, 18 images · non-contrast
Comparison: None.

CLINICAL DATA: Two weeks of LEFT flank pain radiating to RIGHT
flank with urinary urgency.

EXAM:
CT ABDOMEN AND PELVIS WITHOUT CONTRAST
TECHNIQUE: Multidetector CT imaging of the abdomen and pelvis was performed
following the standard protocol without IV contrast.

[Series 3: ap without · axial · non-contrast · 0.63mm/px · z∈[+866,+1226]mm · 13 of 82 slices shown, 15 images]
[im 5/82  soft-tissue]
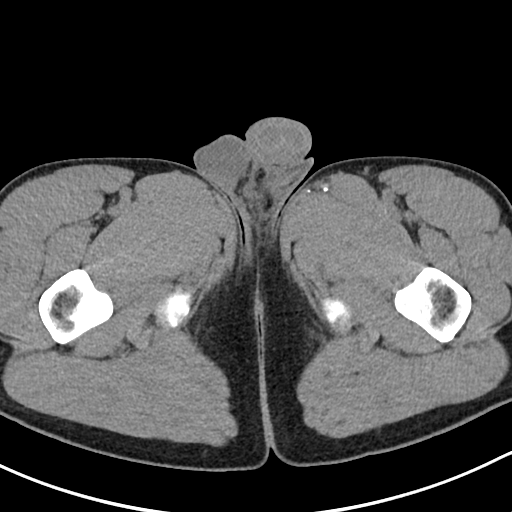
[im 5/82  bone]
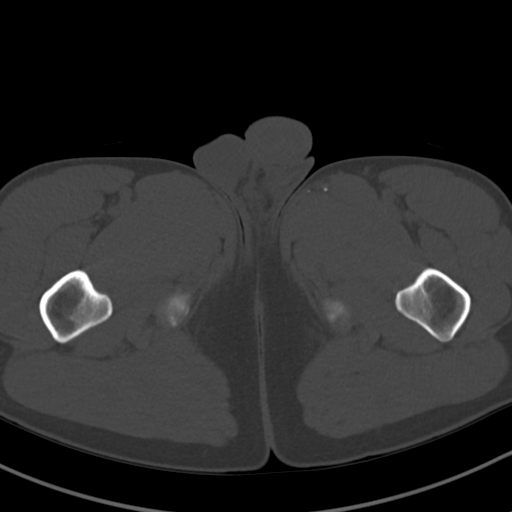
[im 13/82  soft-tissue]
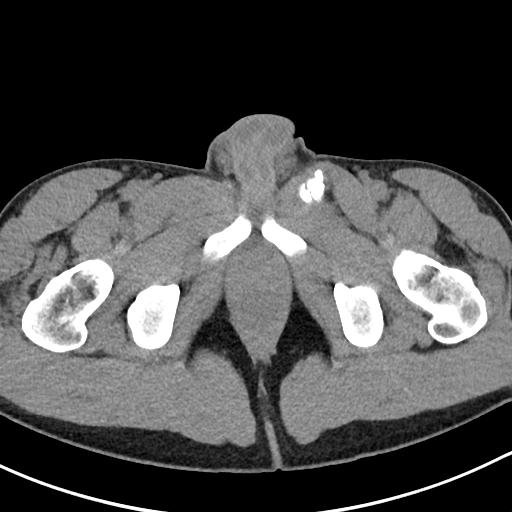
[im 18/82  soft-tissue]
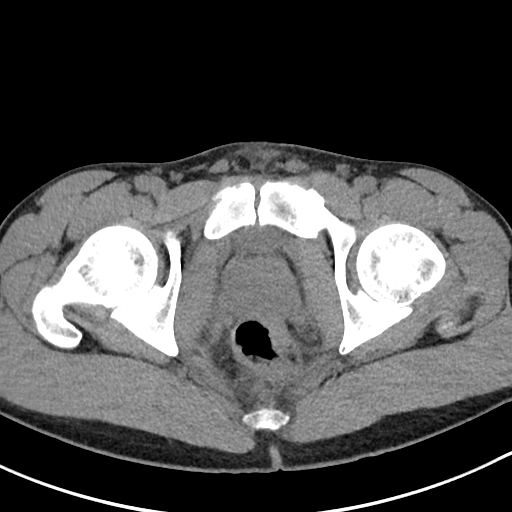
[im 22/82  soft-tissue]
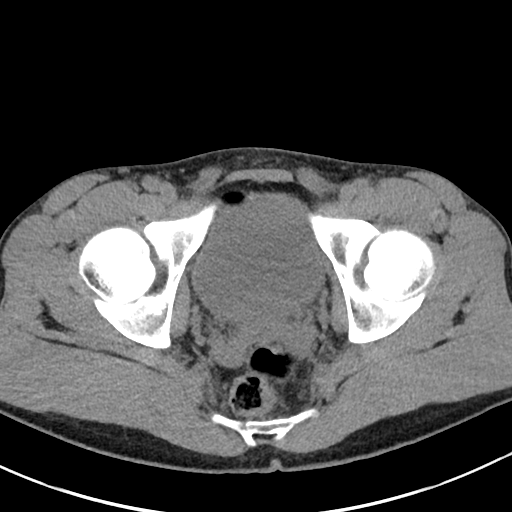
[im 30/82  soft-tissue]
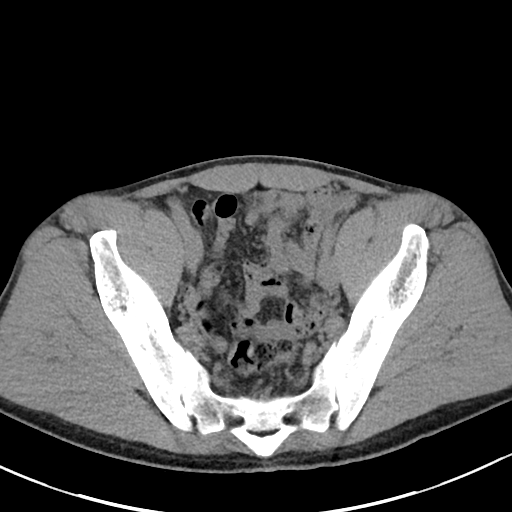
[im 35/82  soft-tissue]
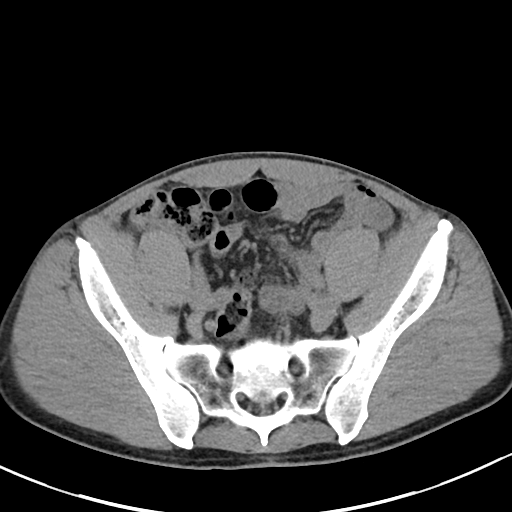
[im 43/82  soft-tissue]
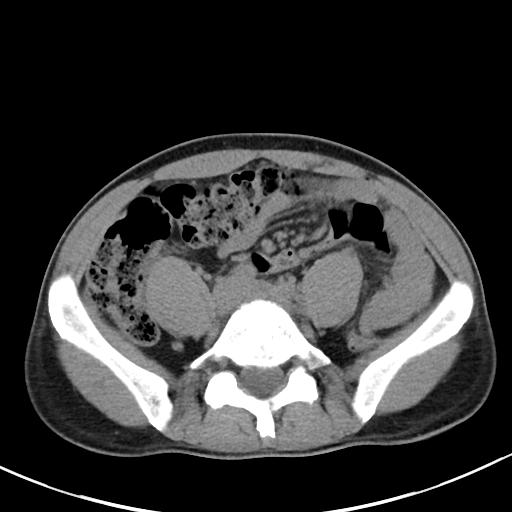
[im 47/82  soft-tissue]
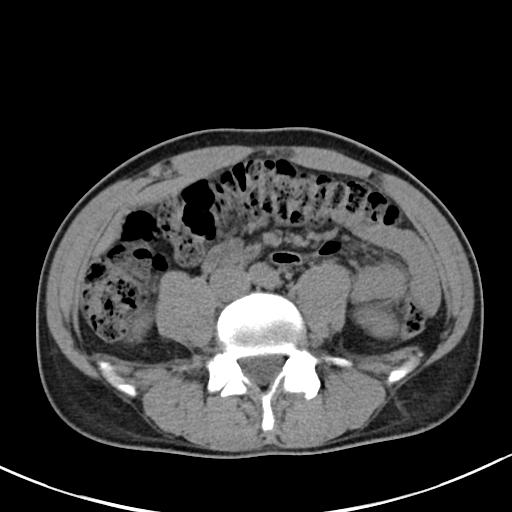
[im 52/82  soft-tissue]
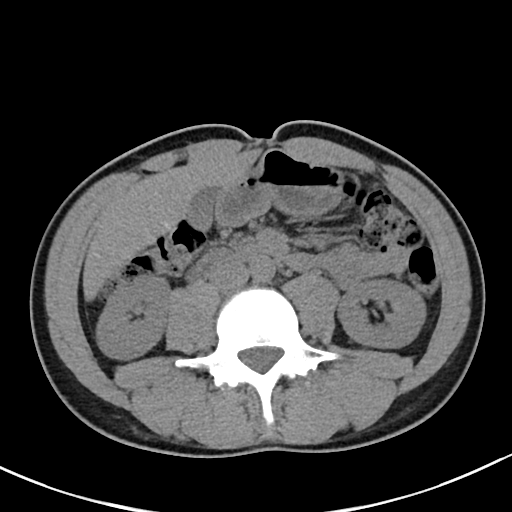
[im 52/82  bone]
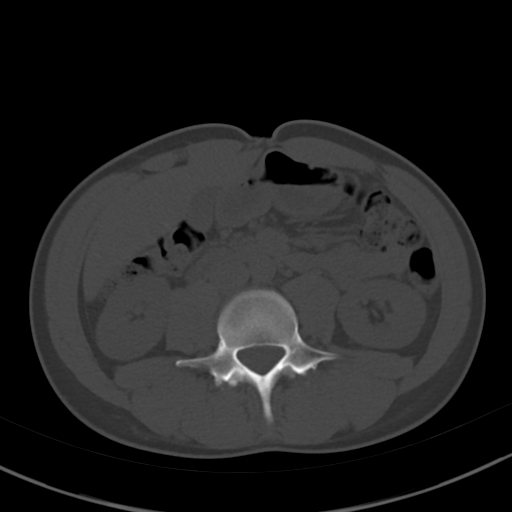
[im 60/82  soft-tissue]
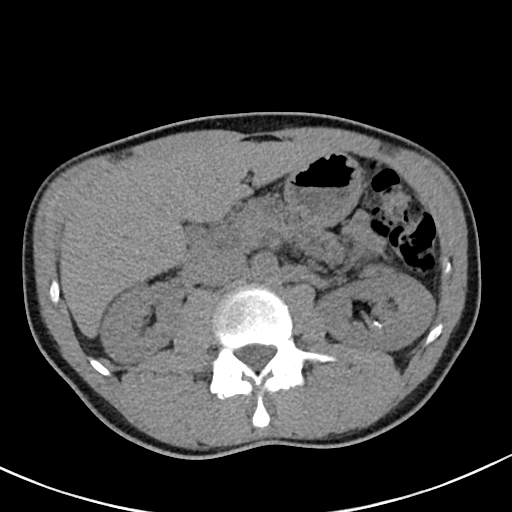
[im 64/82  soft-tissue]
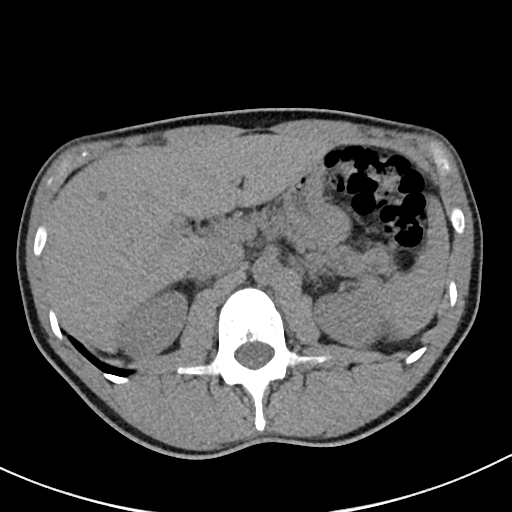
[im 69/82  soft-tissue]
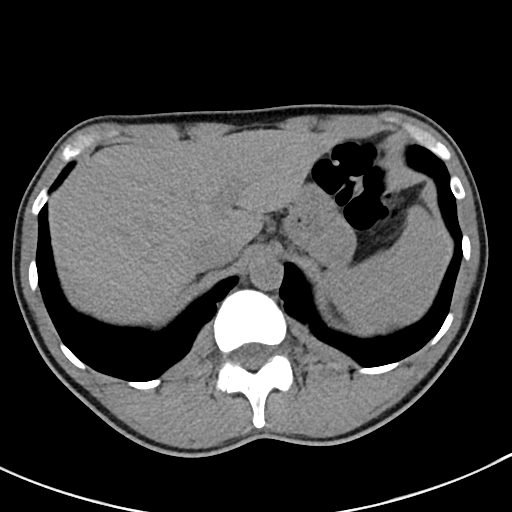
[im 77/82  soft-tissue]
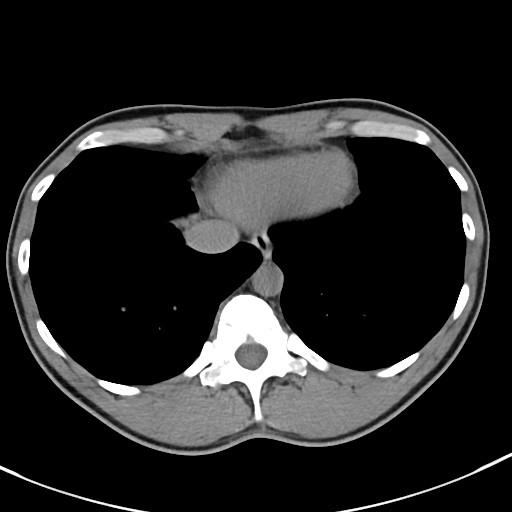

[Series 6: cor · coronal · 0.65mm/px · 3 of 87 slices shown]
[im 29/87  soft-tissue]
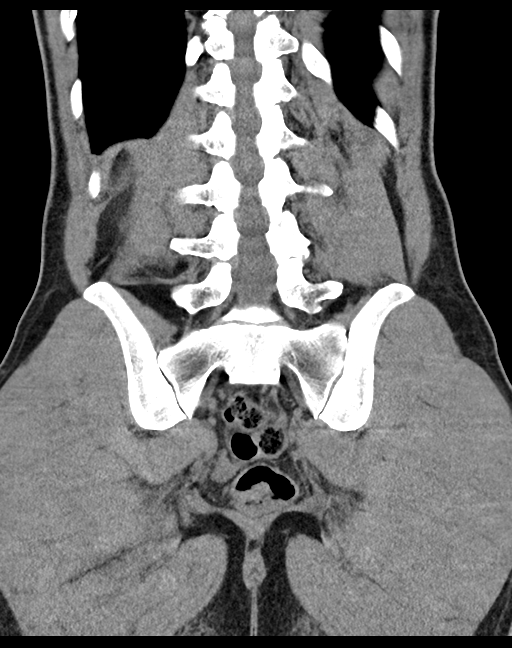
[im 39/87  soft-tissue]
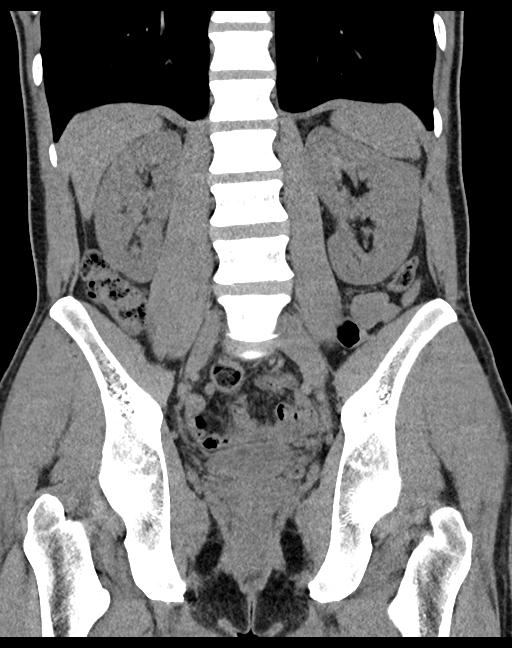
[im 48/87  soft-tissue]
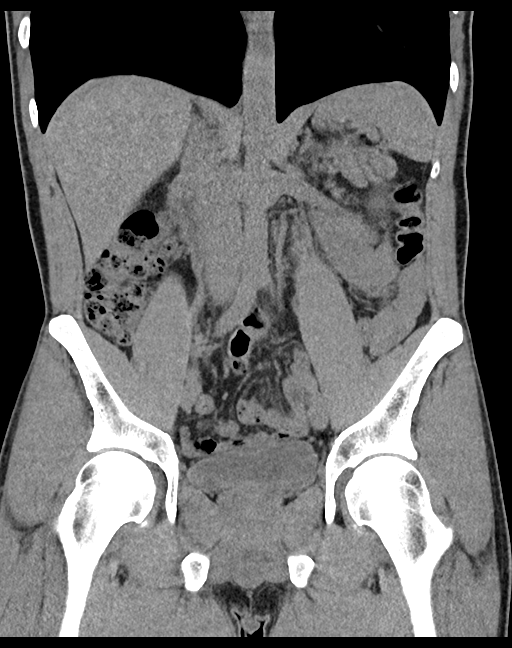

[16 of 46 positions shown; findings below may reference images not displayed]

FINDINGS: Lower chest: No acute abnormality.

Hepatobiliary: Probable small cyst within the RIGHT liver lobe, too
small to definitively characterize. No suspicious lesion within the
liver. Gallbladder appears normal. No bile duct dilatation.

Pancreas: Unremarkable. No pancreatic ductal dilatation or
surrounding inflammatory changes.

Spleen: Normal in size without focal abnormality.

Adrenals/Urinary Tract: 2 adjacent stones within the LEFT renal
pelvis measuring 4 mm and 1 mm respectively no associated
hydronephrosis. RIGHT kidney is unremarkable without stone or
hydronephrosis. No perinephric inflammation bilaterally.

3 mm stone within the bladder.

Stomach/Bowel: Bowel is normal in caliber. No bowel wall thickening
or evidence of bowel wall inflammation seen. Appendix is normal.
Stomach is unremarkable.

Vascular/Lymphatic: No significant vascular findings are present. No
enlarged abdominal or pelvic lymph nodes.

Reproductive: Prostate is unremarkable.

Other: No free fluid or abscess collection. No free intraperitoneal
air.

Musculoskeletal: No acute or suspicious osseous abnormality.
IMPRESSION: 1. 3 mm stone within the dependent portion of the bladder,
presumably recently passed via the LEFT ureter per clinical
symptoms. No ureteral stone appreciated at this time.
2. Two LEFT renal stones, measuring 4 mm and 1 mm respectively. No
associated hydronephrosis.

## 2020-03-06 ENCOUNTER — Emergency Department (HOSPITAL_COMMUNITY)
Admission: EM | Admit: 2020-03-06 | Discharge: 2020-03-07 | Disposition: A | Discharge status: Home / Self Care | Payer: Self-pay | Attending: Emergency Medicine | Admitting: Emergency Medicine

## 2020-03-06 ENCOUNTER — Other Ambulatory Visit: Payer: Self-pay

## 2020-03-06 DIAGNOSIS — R319 Hematuria, unspecified: Secondary | ICD-10-CM

## 2020-03-06 DIAGNOSIS — N201 Calculus of ureter: Secondary | ICD-10-CM | POA: Insufficient documentation

## 2020-03-06 NOTE — ED Triage Notes (Signed)
Pt said as of Sunday he started having LUQ pain that stayed in the area and into his back. Pt said the pain was dull. Pt said has been getting worse over the last day and tonight he noticed very dark urine and blood in the urine.

## 2020-03-07 ENCOUNTER — Emergency Department (HOSPITAL_COMMUNITY): Payer: Self-pay

## 2020-03-07 LAB — CBC WITH DIFFERENTIAL/PLATELET
Abs Immature Granulocytes: 0.01 10*3/uL (ref 0.00–0.07)
Basophils Absolute: 0 10*3/uL (ref 0.0–0.1)
Basophils Relative: 0 %
Eosinophils Absolute: 0.1 10*3/uL (ref 0.0–0.5)
Eosinophils Relative: 2 %
HCT: 46.2 % (ref 39.0–52.0)
Hemoglobin: 15.2 g/dL (ref 13.0–17.0)
Immature Granulocytes: 0 %
Lymphocytes Relative: 25 %
Lymphs Abs: 1.4 10*3/uL (ref 0.7–4.0)
MCH: 31 pg (ref 26.0–34.0)
MCHC: 32.9 g/dL (ref 30.0–36.0)
MCV: 94.3 fL (ref 80.0–100.0)
Monocytes Absolute: 0.5 10*3/uL (ref 0.1–1.0)
Monocytes Relative: 9 %
Neutro Abs: 3.5 10*3/uL (ref 1.7–7.7)
Neutrophils Relative %: 64 %
Platelets: 211 10*3/uL (ref 150–400)
RBC: 4.9 MIL/uL (ref 4.22–5.81)
RDW: 12.1 % (ref 11.5–15.5)
WBC: 5.5 10*3/uL (ref 4.0–10.5)
nRBC: 0 % (ref 0.0–0.2)

## 2020-03-07 LAB — URINALYSIS, ROUTINE W REFLEX MICROSCOPIC

## 2020-03-07 LAB — BASIC METABOLIC PANEL
Anion gap: 10 (ref 5–15)
BUN: 9 mg/dL (ref 6–20)
CO2: 28 mmol/L (ref 22–32)
Calcium: 9.5 mg/dL (ref 8.9–10.3)
Chloride: 101 mmol/L (ref 98–111)
Creatinine, Ser: 1 mg/dL (ref 0.61–1.24)
GFR, Estimated: >60 mL/min (ref >60)
Glucose, Bld: 97 mg/dL (ref 70–99)
Potassium: 3.7 mmol/L (ref 3.5–5.1)
Sodium: 139 mmol/L (ref 135–145)

## 2020-03-07 LAB — URINALYSIS, MICROSCOPIC (REFLEX): RBC / HPF: >50 RBC/hpf (ref 0–5)

## 2020-03-07 MED ORDER — IBUPROFEN 200 MG PO TABS
600.0000 mg | ORAL_TABLET | Freq: Once | ORAL | Status: AC
  Administered 2020-03-07: 600 mg via ORAL
  Filled 2020-03-07: qty 1

## 2020-03-07 MED ORDER — TAMSULOSIN HCL 0.4 MG PO CAPS: 0.4000 mg | ORAL_CAPSULE | Freq: Every day | ORAL | 0 refills | Status: AC

## 2020-03-07 NOTE — Discharge Instructions (Addendum)
Thank you for allowing me to care for you today in the Emergency Department.   You need to call to schedule a follow-up appointment with [facility].  Their contact information is listed above.  Your urine has been sent for culture.  Please take 1 tablet of Flomax daily until you pass the 3 kidney stones.  Take 650 mg of Tylenol or 600 mg of ibuprofen with food every 6 hours for pain.  You can alternate between these 2 medications every 3 hours if your pain returns.  For instance, you can take Tylenol at noon, followed by a dose of ibuprofen at 3, followed by second dose of Tylenol and 6.  You need to get established with a primary care provider for further evaluation of your pelvis.  You should also receive a call from the hospital social worker to help you set up an appointment.  Return to the emergency department if you stop reducing urine, if you develop uncontrollable vomiting, high fevers, severe, uncontrollable abdominal pain, or other new, concerning symptoms.

## 2020-03-07 NOTE — ED Provider Notes (Signed)
[facility]   CSN: [id number] Arrival date & time: 03/06/20  2341     History Chief Complaint  Patient presents with  . Flank Pain    Jeff GripJoshua J Ramos is a 39 y.o. male with no significant past medical history who presents to the emergency department with a chief complaint of left flank pain.  The patient reports that he developed generalized abdominal pain and bloating 5 days ago.  He reports that the pain has been constant, but seem to worsen over the last 24 hours when the pain seemed to localize more towards his left flank.  No treatment for pain prior to arrival.  No other known aggravating alleviating factors.  Over the last day, he reports that his urine became more dark.  He also began having difficulty emptying his bladder.  Earlier tonight, he developed gross hematuria.  He does report a history of previous kidney stones a couple years ago.  He never followed up with urology, but reports that he passed a stone after he was seen in the ER.  He denies fever, chills, nausea, vomiting, diarrhea, constipation, dysuria, penile or testicular pain or swelling, penile discharge, rectal pain, night sweats, weight loss.  He also notes that there is a bony area on his left anterior pelvis that has been present for many years that has been slowly increasing in size.  No associated tenderness to palpation.  He reports that he shown it to previous health professionals in the past, but he was never diagnosed with anything.  He has no concerns for STIs.  States that he is not sexually active.  No treatment prior to arrival.  The history is provided by the patient and medical records. No language interpreter was used.       No past medical history on file.  There are no problems to display for this patient.   No past surgical history on file.     No family history on file.  Social History   Tobacco Use  . Smoking status: Never  Smoker  . Smokeless tobacco: Never Used  Substance Use Topics  . Alcohol use: No  . Drug use: No    Home Medications Prior to Admission medications   Medication Sig Start Date End Date Taking? Authorizing Provider  Multiple Vitamins-Minerals (MULTIVITAMIN WITH MINERALS) tablet Take 1 tablet by mouth daily.    [provider]  naproxen (NAPROSYN) 500 MG tablet Take 1 tablet (500 mg total) by mouth 2 (two) times daily. 09/16/17   Janne NapoleonNeese, Hope M, NP  oxyCODONE-acetaminophen (PERCOCET/ROXICET) 5-325 MG tablet Take 1 tablet by mouth every 6 (six) hours as needed for severe pain. 09/08/17   Hedges, Tinnie GensJeffrey, PA-C  tamsulosin (FLOMAX) 0.4 MG CAPS capsule Take 1 capsule (0.4 mg total) by mouth daily. 03/07/20   Stephanne Greeley A, PA-C    Allergies    Patient has no known allergies.  Review of Systems   Review of Systems  Constitutional: Negative for appetite change, chills, diaphoresis and fever.  Respiratory: Negative for shortness of breath.   Cardiovascular: Negative for chest pain.  Gastrointestinal: Positive for abdominal pain. Negative for constipation, diarrhea, nausea and vomiting.  Genitourinary: Positive for difficulty urinating and flank pain. Negative for discharge, dysuria, penile pain, penile swelling, scrotal swelling and testicular pain.  Musculoskeletal: Negative for back pain, gait problem, myalgias, neck pain and neck stiffness.  Skin: Negative for rash.  Allergic/Immunologic: Negative for immunocompromised state.  Neurological: Negative for  weakness and headaches.  Psychiatric/Behavioral: Negative for confusion.    Physical Exam Updated Vital Signs BP 140/86 (BP Location: Left Arm)   Pulse 80   Temp 98.6 F (37 C) (Oral)   Resp 16   Ht 5\' 11"  (1.803 m)   Wt 75.3 kg   SpO2 98%   BMI 23.15 kg/m   Physical Exam Vitals and nursing note reviewed.  Constitutional:      General: He is not in acute distress.    Appearance: He is well-developed. He is not  ill-appearing, toxic-appearing or diaphoretic.  HENT:     Head: Normocephalic.  Eyes:     Conjunctiva/sclera: Conjunctivae normal.  Cardiovascular:     Rate and Rhythm: Normal rate and regular rhythm.     Pulses: Normal pulses.     Heart sounds: Normal heart sounds. No murmur heard.  No friction rub. No gallop.   Pulmonary:     Effort: Pulmonary effort is normal. No respiratory distress.     Breath sounds: No stridor. No wheezing, rhonchi or rales.  Chest:     Chest wall: No tenderness.  Abdominal:     General: There is no distension.     Palpations: Abdomen is soft. There is no mass.     Tenderness: There is no abdominal tenderness. There is no right CVA tenderness, left CVA tenderness, guarding or rebound.     Hernia: No hernia is present.     Comments: Abdomen is soft, nontender, nondistended.  No CVA tenderness bilaterally.  Normoactive bowel sounds.  No tenderness over McBurney's point.  No hepatosplenomegaly.  Musculoskeletal:     Cervical back: Neck supple.     Right lower leg: No edema.     Left lower leg: No edema.       Legs:  Skin:    General: Skin is warm and dry.  Neurological:     Mental Status: He is alert.  Psychiatric:        Behavior: Behavior normal.     ED Results / Procedures / Treatments   Labs (all labs ordered are listed, but only abnormal results are displayed) Labs Reviewed  URINALYSIS, ROUTINE W REFLEX MICROSCOPIC - Abnormal; Notable for the following components:      Result Value   Color, Urine RED (*)    APPearance TURBID (*)    Glucose, UA   (*)    Value: TEST NOT REPORTED DUE TO COLOR INTERFERENCE OF URINE PIGMENT   Hgb urine dipstick   (*)    Value: TEST NOT REPORTED DUE TO COLOR INTERFERENCE OF URINE PIGMENT   Bilirubin Urine   (*)    Value: TEST NOT REPORTED DUE TO COLOR INTERFERENCE OF URINE PIGMENT   Ketones, ur   (*)    Value: TEST NOT REPORTED DUE TO COLOR INTERFERENCE OF URINE PIGMENT   Protein, ur   (*)    Value: TEST NOT  REPORTED DUE TO COLOR INTERFERENCE OF URINE PIGMENT   Nitrite   (*)    Value: TEST NOT REPORTED DUE TO COLOR INTERFERENCE OF URINE PIGMENT   Leukocytes,Ua   (*)    Value: TEST NOT REPORTED DUE TO COLOR INTERFERENCE OF URINE PIGMENT   All other components within normal limits  URINALYSIS, MICROSCOPIC (REFLEX) - Abnormal; Notable for the following components:   Bacteria, UA FEW (*)    All other components within normal limits  URINE CULTURE  CBC WITH DIFFERENTIAL/PLATELET  BASIC METABOLIC PANEL    EKG None  Radiology  CT Renal Stone Study  Result Date: 03/07/2020 CLINICAL DATA:  Hematuria, unknown cause. EXAM: CT ABDOMEN AND PELVIS WITHOUT CONTRAST TECHNIQUE: Multidetector CT imaging of the abdomen and pelvis was performed following the standard protocol without IV contrast. COMPARISON:  CT renal 09/08/2017 FINDINGS: Lower chest: No acute abnormality. Hepatobiliary: No focal liver abnormality is seen. No gallstones, gallbladder wall thickening, or biliary dilatation. Pancreas: Unremarkable. No pancreatic ductal dilatation or surrounding inflammatory changes. Spleen: Normal in size without focal abnormality. Adrenals/Urinary Tract: Adrenal glands are unremarkable. Interval distal migration of previously identified nephrolithiasis now located at the left ureteropelvic junction. There is suggestion of a total of 3 calcified stones measuring 52mm, 52mm, 1 mm. There is trace fullness of the left renal pelvis with no hydronephrosis of the left kidney. Otherwise no nephrolithiasis, no hydronephrosis, and no contour-deforming renal mass. No right ureterolithiasis. No hydroureter. The urinary bladder is unremarkable. Stomach/Bowel: Stomach is within normal limits. No evidence of bowel wall thickening or dilatation. Appendix appears normal. Vascular/Lymphatic: No significant vascular findings are present. No enlarged abdominal or pelvic lymph nodes. Reproductive: Prostate is unremarkable. Other: No  intraperitoneal free fluid. No intraperitoneal free gas. No organized fluid collection. Musculoskeletal: Left superior pubic rami exostosis with subjacent heterotopic calcification. Indeterminate sclerotic lesion within the right acetabulum along the femoroacetabular joint. Indeterminate lytic peripherally sclerosed lesion within the right iliac bone. Indeterminate lytic lesion within the right iliac bone. Punctate densely sclerotic lesion within the left iliac bone likely represents a bone island. IMPRESSION: 1. Early/nonobstructive left ureteropelvic junction stones (total of three measuring up to 3 mm). 2. Several indeterminate pelvic osseous lesions as described above. Electronically Signed   By: Tish Frederickson M.D.   On: 03/07/2020 03:15    Procedures Procedures (including critical care time)  Medications Ordered in ED Medications  ibuprofen (ADVIL) tablet 600 mg (600 mg Oral Given 03/07/20 0417)    ED Course  I have reviewed the triage vital signs and the nursing notes.  Pertinent labs & imaging results that were available during my care of the patient were reviewed by me and considered in my medical decision making (see chart for details).    MDM Rules/Calculators/A&P                          39 year old male with no significant past medical history presenting with painful gross hematuria, onset tonight, difficulty emptying his bladder, left flank pain after developing generalized abdominal pain and bloating 5 days ago.  No constitutional symptoms.  Vital signs are normal.  Abdominal exam is benign.  CBC and metabolic panel are unremarkable.  UA with calcium oxalate crystals and RBCs.  No evidence of infection.  However, urine culture has been sent.  Presentation is somewhat atypical for ureterolithiasis as his exam is unremarkable and he has not required any medication for pain since symptoms began almost 5 days ago.  Will order CT renal stone study for further evaluation.  Imaging  has been personally reviewed by me.  There are early nonobstructive left ureteropelvic junction stones (3 in total).  There is also trace of fullness of the left renal pelvis with no hydronephrosis on the left.  No renal masses.  No bladder masses.  There are also several indeterminate pelvic osseous lesions: "Left superior pubic rami exostosis with subjacent heterotopic calcification. Indeterminate sclerotic lesion within the rightacetabulum along the femoroacetabular joint. Indeterminate lytic peripherally sclerosed lesion within the right iliac bone. Indeterminate lytic lesion within the right iliac  bone. Punctate densely sclerotic lesion within the left iliac bone likely represents a bone island."  Discussed these findings with the patient.  He then points out a bony mass noted to the left pubic ramus that has been present for many years.  I reviewed his CT stone study from 2019 that did not comment on suspicious osseous abnormalities.  Alkaline phosphatase was not obtained today, but can be obtained in an outpatient setting.  We will start the patient on Flomax.  He will be given a referral to urology.  He has not had any other treatment for symptoms prior to arrival.  Ibuprofen given.  Will defer narcotics at this time as his exam is benign.  I have also placed a consult to transitions of care as the patient should follow up with primary care regarding lesions noted on his pelvis.  I have discussed this plan with the patient who is agreeable at this time.  No further urgent or emergent work-up is indicated.  He is hemodynamically stable and in no acute distress.  Safe for discharge to home with outpatient follow-up as indicated.  Final Clinical Impression(s) / ED Diagnoses Final diagnoses:  Ureterolithiasis  Hematuria, unspecified type    Rx / DC Orders ED Discharge Orders         Ordered    tamsulosin (FLOMAX) 0.4 MG CAPS capsule  Daily        03/07/20 0410           Frederik Pear A,  PA-C 03/07/20 [id number]    Zadie Rhine, MD 03/08/20 [phone]

## 2020-03-08 LAB — URINE CULTURE: Culture: NO GROWTH

## 2021-05-27 ENCOUNTER — Encounter (HOSPITAL_COMMUNITY): Payer: Self-pay

## 2021-05-27 ENCOUNTER — Emergency Department (HOSPITAL_COMMUNITY)
Admission: EM | Admit: 2021-05-27 | Discharge: 2021-05-27 | Disposition: A | Discharge status: Home / Self Care | Payer: Self-pay | Attending: Emergency Medicine | Admitting: Emergency Medicine

## 2021-05-27 ENCOUNTER — Other Ambulatory Visit: Payer: Self-pay

## 2021-05-27 DIAGNOSIS — Z711 Person with feared health complaint in whom no diagnosis is made: Secondary | ICD-10-CM

## 2021-05-27 DIAGNOSIS — Z202 Contact with and (suspected) exposure to infections with a predominantly sexual mode of transmission: Secondary | ICD-10-CM | POA: Insufficient documentation

## 2021-05-27 DIAGNOSIS — R369 Urethral discharge, unspecified: Secondary | ICD-10-CM | POA: Insufficient documentation

## 2021-05-27 MED ORDER — STERILE WATER FOR INJECTION IJ SOLN
INTRAMUSCULAR | Status: AC
  Administered 2021-05-27: 10 mL
  Filled 2021-05-27: qty 10

## 2021-05-27 MED ORDER — CEFTRIAXONE SODIUM 1 G IJ SOLR
500.0000 mg | Freq: Once | INTRAMUSCULAR | Status: AC
  Administered 2021-05-27: 500 mg via INTRAMUSCULAR
  Filled 2021-05-27: qty 10

## 2021-05-27 NOTE — Discharge Instructions (Signed)
Follow-up in your MyChart account in the next 5 days for your test results. Abstain from intercourse until you know your test results. If results are positive, you may need additional treatment; will need to notify partner(s); abstain from intercourse for 10 days until all partners have completed treatment.

## 2021-05-27 NOTE — ED Provider Notes (Signed)
[facility]-EMERGENCY DEPT Provider Note   CSN: [id number] Arrival date & time: 05/27/21  1445     History  Chief Complaint  Patient presents with   Penile Discharge    Jeff Ramos is a 41 y.o. male.  41 year old male presents with concern for urethral discharge for the past 4 days after recent unprotected intercourse.  Prior history of STD.  Denies testicle pain or swelling, rashes, lesions, any other complaints or concerns today.      Home Medications Prior to Admission medications   Medication Sig Start Date End Date Taking? Authorizing Provider  Multiple Vitamins-Minerals (MULTIVITAMIN WITH MINERALS) tablet Take 1 tablet by mouth daily.    [provider]  naproxen (NAPROSYN) 500 MG tablet Take 1 tablet (500 mg total) by mouth 2 (two) times daily. 09/16/17   Janne Napoleon, NP  oxyCODONE-acetaminophen (PERCOCET/ROXICET) 5-325 MG tablet Take 1 tablet by mouth every 6 (six) hours as needed for severe pain. 09/08/17   Hedges, Tinnie Gens, PA-C  tamsulosin (FLOMAX) 0.4 MG CAPS capsule Take 1 capsule (0.4 mg total) by mouth daily. 03/07/20   McDonald, Mia A, PA-C      Allergies    Patient has no known allergies.    Review of Systems   Review of Systems  Physical Exam Updated Vital Signs BP (!) 154/110 (BP Location: Left Arm)    Pulse 67    Temp 97.7 F (36.5 C) (Oral)    Resp 18    SpO2 100%  Physical Exam Vitals and nursing note reviewed. Exam conducted with a chaperone present.  Constitutional:      General: He is not in acute distress.    Appearance: He is well-developed. He is not diaphoretic.  HENT:     Head: Normocephalic and atraumatic.  Pulmonary:     Effort: Pulmonary effort is normal.  Genitourinary:    Penis: Normal and circumcised. No erythema, tenderness, discharge, swelling or lesions.   Neurological:     Mental Status: He is alert and oriented to person, place, and time.  Psychiatric:        Behavior: Behavior normal.     ED Results / Procedures / Treatments   Labs (all labs ordered are listed, but only abnormal results are displayed) Labs Reviewed  GC/CHLAMYDIA PROBE AMP ([place]) NOT AT [facility]    EKG None  Radiology No results found.  Procedures Procedures    Medications Ordered in ED Medications  cefTRIAXone (ROCEPHIN) injection 500 mg (500 mg Intramuscular Given 05/27/21 1649)  sterile water (preservative free) injection (10 mLs  Given 05/27/21 1650)    ED Course/ Medical Decision Making/ A&P Clinical Course as of 05/27/21 1847  Wed May 27, 2021  3219 41 year old male presents with complaint of urethral discharge as above.  On exam, no discharge present however urethral swab is yellow after collection.  Patient is treated with Rocephin in the ER pending test results.  Patient is advised to abstain from intercourse, follow-up with his test results and if any results are positive requiring further treatment, medication can either be called in or he can follow-up with the health department for further treatment.  Patient to notify partners if his test are positive. [LM]    Clinical Course User Index [LM] Jeannie Fend, PA-C                           Medical Decision Making  Final Clinical Impression(s) / ED Diagnoses Final diagnoses:  Concern about STD in male without diagnosis    Rx / DC Orders ED Discharge Orders     None         Jeannie Fend, PA-C 05/27/21 1847    Sloan Leiter, DO 05/30/21 0038

## 2021-05-27 NOTE — ED Notes (Signed)
I provided reinforced discharge education based off of discharge instructions. Pt acknowledged and understood my education. Pt had no further questions/concerns for provider/myself.  °

## 2021-05-27 NOTE — ED Triage Notes (Signed)
Pt c/o penile discharge starting before Christmas. Pt denies being exposed to a STD, but states he did have unprotected sex before Christmas, when his symptoms started. Pt denies pain, denies tenderness. Pt states he is not sure if the discharge is true discharge, or if its urine.

## 2021-05-28 LAB — GC/CHLAMYDIA PROBE AMP (~~LOC~~) NOT AT ARMC
Chlamydia: NEGATIVE
Comment: NEGATIVE
Comment: NORMAL
Neisseria Gonorrhea: NEGATIVE

## 2021-06-02 ENCOUNTER — Emergency Department (HOSPITAL_COMMUNITY)
Admission: EM | Admit: 2021-06-02 | Discharge: 2021-06-03 | Disposition: A | Discharge status: Home / Self Care | Payer: Self-pay | Attending: Emergency Medicine | Admitting: Emergency Medicine

## 2021-06-02 ENCOUNTER — Other Ambulatory Visit: Payer: Self-pay

## 2021-06-02 ENCOUNTER — Encounter (HOSPITAL_COMMUNITY): Payer: Self-pay

## 2021-06-02 DIAGNOSIS — N342 Other urethritis: Secondary | ICD-10-CM | POA: Insufficient documentation

## 2021-06-02 LAB — URINALYSIS, ROUTINE W REFLEX MICROSCOPIC
Bilirubin Urine: NEGATIVE
Glucose, UA: NEGATIVE mg/dL
Hgb urine dipstick: NEGATIVE
Ketones, ur: NEGATIVE mg/dL
Leukocytes,Ua: NEGATIVE
Nitrite: NEGATIVE
Protein, ur: NEGATIVE mg/dL
Specific Gravity, Urine: >1.03 — ABNORMAL HIGH (ref 1.005–1.030)
pH: 5.5 (ref 5.0–8.0)

## 2021-06-02 LAB — HIV ANTIBODY (ROUTINE TESTING W REFLEX): HIV Screen 4th Generation wRfx: NONREACTIVE

## 2021-06-02 NOTE — ED Provider Triage Note (Signed)
Emergency Medicine Provider Triage Evaluation Note  Jeff Ramos , a 41 y.o. male  was evaluated in triage.  Pt complains of penile itching irritation at the urethra. Denies penile discharge or lesions,denies pain/swelling to the testicles. Recently had oral intercourse.  Review of Systems  Positive: Irritation to the urethra Negative: Rashes, lesions, discharge  Physical Exam  BP (!) 150/89 (BP Location: Right Arm)    Pulse 74    Temp 98.4 F (36.9 C) (Oral)    Resp 16    Ht 5\' 11"  (1.803 m)    Wt 76 kg    SpO2 100%    BMI 23.37 kg/m  Gen:   Awake, no distress   Resp:  Normal effort  MSK:   Moves extremities without difficulty   Medical Decision Making  Medically screening exam initiated at 5:05 PM.  Appropriate orders placed.  Jeff Ramos was informed that the remainder of the evaluation will be completed by another provider, this initial triage assessment does not replace that evaluation, and the importance of remaining in the ED until their evaluation is complete.     Loraine Grip, PA-C 06/02/21 1705

## 2021-06-02 NOTE — ED Notes (Signed)
Pt observed walking out of the lobby, unsure if will return

## 2021-06-02 NOTE — ED Triage Notes (Signed)
Pt arrives POV for eval of "itchy penis" Pt reports itching onset 05/26/2021. Seen at [facility] for same, tested and treated at that time, reports no improvement to the itching. Pt denies DC, denies testicular pain

## 2021-06-03 LAB — URINE CULTURE: Culture: NO GROWTH

## 2021-06-03 LAB — GC/CHLAMYDIA PROBE AMP (~~LOC~~) NOT AT ARMC
Chlamydia: NEGATIVE
Comment: NEGATIVE
Comment: NORMAL
Neisseria Gonorrhea: NEGATIVE

## 2021-06-03 LAB — RPR: RPR Ser Ql: NONREACTIVE

## 2021-06-03 MED ORDER — DOXYCYCLINE HYCLATE 100 MG PO CAPS: 100.0000 mg | ORAL_CAPSULE | Freq: Two times a day (BID) | ORAL | 0 refills | Status: DC

## 2021-06-03 NOTE — ED Notes (Signed)
Lab to add on urine culture

## 2021-06-03 NOTE — Discharge Instructions (Addendum)
You were seen today for ongoing urethral irritation.  Your prior gonorrhea and Chlamydia testing were negative.  You were retested but would be low risk if you abstain from sexual activity.  You will be trialed with doxycycline given your persistent symptoms.  Abstain from sexual activity for the next 10 days.  You will be called with any positive results.

## 2021-06-03 NOTE — ED Provider Notes (Signed)
Jeff Ramos   CSN: [id number] Arrival date & time: 06/02/21  1614     History  Chief Complaint  Patient presents with   Penis Itching    Jeff Ramos is a 41 y.o. male.  HPI     This is a 41 year old male with no reported past medical history who presents with persistent urethral itching.  Patient was seen and evaluated on 1/4 for the same.  That time he had STD testing and 1 dose of Rocephin.  Gonorrhea and Chlamydia testing based on chart review was negative.  Patient states he has had persistent itching at the tip of his penis and urethra.  He denies overt dysuria.  He has not noted any discharge.  He describes the symptoms as itchy and warm.  He states he has not had any sexual contact since his prior evaluation.  He last received oral sex in December.    Chart reviewed.  Negative gonorrhea and Chlamydia testing on 1/4.  Received 1 dose of Rocephin.  No azithromycin or doxycycline.  Home Medications Prior to Admission medications   Medication Sig Start Date End Date Taking? Authorizing Provider  doxycycline (VIBRAMYCIN) 100 MG capsule Take 1 capsule (100 mg total) by mouth 2 (two) times daily. 06/03/21  Yes Richanda Darin, Mayer Masker, MD  Multiple Vitamins-Minerals (MULTIVITAMIN WITH MINERALS) tablet Take 1 tablet by mouth daily.    [provider]  naproxen (NAPROSYN) 500 MG tablet Take 1 tablet (500 mg total) by mouth 2 (two) times daily. 09/16/17   Janne Napoleon, NP  oxyCODONE-acetaminophen (PERCOCET/ROXICET) 5-325 MG tablet Take 1 tablet by mouth every 6 (six) hours as needed for severe pain. 09/08/17   Hedges, Tinnie Gens, PA-C  tamsulosin (FLOMAX) 0.4 MG CAPS capsule Take 1 capsule (0.4 mg total) by mouth daily. 03/07/20   McDonald, Mia A, PA-C      Allergies    Patient has no known allergies.    Review of Systems   Review of Systems  Constitutional:  Negative for fever.  Genitourinary:        Urethral irritation   All other systems reviewed and are negative.  Physical Exam Updated Vital Signs BP (!) 141/93 (BP Location: Right Arm)    Pulse 65    Temp (!) 97.3 F (36.3 C) (Oral)    Resp (!) 22    Ht 1.803 m (5\' 11" )    Wt 76 kg    SpO2 99%    BMI 23.37 kg/m  Physical Exam Vitals and nursing Ramos reviewed. Exam conducted with a chaperone present.  Constitutional:      Appearance: He is well-developed.  HENT:     Head: Normocephalic and atraumatic.  Cardiovascular:     Rate and Rhythm: Normal rate and regular rhythm.  Pulmonary:     Effort: Pulmonary effort is normal. No respiratory distress.     Breath sounds: No wheezing.  Abdominal:     Palpations: Abdomen is soft.     Tenderness: There is no abdominal tenderness.  Genitourinary:    Comments: Circumcised penis, no lesions noted, no penile discharge noted, normal testicular lie Musculoskeletal:     Cervical back: Neck supple.  Lymphadenopathy:     Cervical: No cervical adenopathy.  Skin:    General: Skin is warm and dry.  Neurological:     Mental Status: He is alert and oriented to person, place, and time.  Psychiatric:        Mood and  Affect: Mood normal.    ED Results / Procedures / Treatments   Labs (all labs ordered are listed, but only abnormal results are displayed) Labs Reviewed  URINALYSIS, ROUTINE W REFLEX MICROSCOPIC - Abnormal; Notable for the following components:      Result Value   Specific Gravity, Urine >1.030 (*)    All other components within normal limits  URINE CULTURE  HIV ANTIBODY (ROUTINE TESTING W REFLEX)  RPR  GC/CHLAMYDIA PROBE AMP ([place]) NOT AT [facility]    EKG None  Radiology No results found.  Procedures Procedures    Medications Ordered in ED Medications - No data to display  ED Course/ Medical Decision Making/ A&P                           Medical Decision Making  This patient presents to the ED for concern of urethral itching, this involves an extensive number of treatment  options, and is a complaint that carries with it a high risk of complications and morbidity.  The differential diagnosis includes urethritis, UTI, other STD  MDM:    41 year old with persistent urethral itching.  Received 1 dose of Rocephin and negative gonorrhea and Chlamydia testing less than 1 week ago.  He is overall nontoxic and vital signs are reassuring.  Physical exam is fairly benign.  No lesions to suggest herpes.  HIV is negative.  Repeat STD pending although he states he is low risk and has not had any further sexual encounters.  Sent urine culture.  Will treat with 1 week of doxycycline given persistent symptoms.  Again advised to abstain from sexual activity.  (Labs, imaging)  Labs: I Ordered, and personally interpreted labs.  The pertinent results include: Urinalysis without evidence of UTI   Imaging Studies ordered: I ordered imaging studies including none I independently visualized and interpreted imaging. I agree with the radiologist interpretation  Critical Interventions: na   Consultations Obtained: I requested consultation with the none,  and discussed lab and imaging findings as well as pertinent plan - they recommend: none  Reevaluation: After the interventions noted above, I reevaluated the patient and found that they have :stayed the same  Social Determinants of Health: N/a   Disposition:  home  Co morbidities that complicate the patient evaluation History reviewed. No pertinent past medical history.    Additional history obtained from none External records from outside source obtained and reviewed including none   Cardiac Monitoring: The patient was maintained on a cardiac monitor.  I personally viewed and interpreted the cardiac monitored which showed an underlying rhythm of: none   Medicines  Meds ordered this encounter  Medications   doxycycline (VIBRAMYCIN) 100 MG capsule    Sig: Take 1 capsule (100 mg total) by mouth 2 (two) times daily.     Dispense:  14 capsule    Refill:  0     I have reviewed the patients home medicines and have made adjustments as needed   Problem List / ED Course: Problem List Items Addressed This Visit   None Visit Diagnoses     Urethritis    -  Primary                   Final Clinical Impression(s) / ED Diagnoses Final diagnoses:  Urethritis    Rx / DC Orders ED Discharge Orders          Ordered    doxycycline (VIBRAMYCIN) 100 MG  capsule  2 times daily        06/03/21 0118              Cashmere Harmes, Mayer Maskerourtney F, MD 06/03/21 [phone]

## 2022-05-05 ENCOUNTER — Other Ambulatory Visit: Payer: Self-pay

## 2022-05-05 ENCOUNTER — Encounter (HOSPITAL_COMMUNITY): Payer: Self-pay | Admitting: *Deleted

## 2022-05-05 ENCOUNTER — Emergency Department (HOSPITAL_COMMUNITY)
Admission: EM | Admit: 2022-05-05 | Discharge: 2022-05-05 | Disposition: A | Discharge status: Home / Self Care | Payer: Self-pay | Attending: Emergency Medicine | Admitting: Emergency Medicine

## 2022-05-05 DIAGNOSIS — R300 Dysuria: Secondary | ICD-10-CM | POA: Insufficient documentation

## 2022-05-05 LAB — URINALYSIS, ROUTINE W REFLEX MICROSCOPIC
Bilirubin Urine: NEGATIVE
Glucose, UA: NEGATIVE mg/dL
Hgb urine dipstick: NEGATIVE
Ketones, ur: NEGATIVE mg/dL
Leukocytes,Ua: NEGATIVE
Nitrite: NEGATIVE
Protein, ur: NEGATIVE mg/dL
Specific Gravity, Urine: 1.015 (ref 1.005–1.030)
pH: 8 (ref 5.0–8.0)

## 2022-05-05 LAB — HIV ANTIBODY (ROUTINE TESTING W REFLEX): HIV Screen 4th Generation wRfx: NONREACTIVE

## 2022-05-05 MED ORDER — DOXYCYCLINE HYCLATE 100 MG PO TABS
100.0000 mg | ORAL_TABLET | Freq: Once | ORAL | Status: AC
  Administered 2022-05-05: 100 mg via ORAL
  Filled 2022-05-05: qty 1

## 2022-05-05 MED ORDER — DOXYCYCLINE HYCLATE 100 MG PO CAPS: 100.0000 mg | ORAL_CAPSULE | Freq: Two times a day (BID) | ORAL | 0 refills | Status: AC

## 2022-05-05 MED ORDER — CEFTRIAXONE SODIUM 500 MG IJ SOLR
500.0000 mg | Freq: Once | INTRAMUSCULAR | Status: AC
Start: 2022-05-05 — End: 2022-05-05
  Administered 2022-05-05: 500 mg via INTRAMUSCULAR
  Filled 2022-05-05: qty 500

## 2022-05-05 NOTE — ED Triage Notes (Signed)
The pt thinks that he may have an infection  no discharge no painful urinatiom he just feels strange

## 2022-05-05 NOTE — Discharge Instructions (Signed)
Your STI test results will be available in the next few days on our patient portal.  Please take a course of doxycycline which treats for chlamydia.  Your urinalysis was negative for urinary tract infection.

## 2022-05-05 NOTE — ED Provider Triage Note (Signed)
Emergency Medicine Provider Triage Evaluation Note  Jeff Ramos , a 41 y.o. male  was evaluated in triage.  Pt complains of urethral irritation and thinks he may have an infection.  Hx of urethritis and prior STD.  Denies dysuria, penile discharge, testicular pain, fever, abdominal pain.    Review of Systems  Positive: See above Negative: See above  Physical Exam  BP (!) 140/90   Pulse 69   Temp 98.4 F (36.9 C)   Resp 16   Ht 5\' 11"  (1.803 m)   Wt 76 kg   SpO2 99%   BMI 23.37 kg/m  Gen:   Awake, no distress   Resp:  Normal effort  MSK:   Moves extremities without difficulty  Other:    Medical Decision Making  Medically screening exam initiated at 4:42 PM.  Appropriate orders placed.  LIEV BROCKBANK was informed that the remainder of the evaluation will be completed by another provider, this initial triage assessment does not replace that evaluation, and the importance of remaining in the ED until their evaluation is complete.     Loraine Grip R, Melton Alar 05/05/22 [phone]

## 2022-05-05 NOTE — ED Provider Notes (Signed)
[facility] EMERGENCY DEPARTMENT Provider Note   CSN: [id number] Arrival date & time: 05/05/22  1535     History  Chief Complaint  Patient presents with   pos std    Jeff Ramos is a 41 y.o. male.  HPI   41 year old male presenting to the Emergency Department with concern for STI.  The patient states that he may have an infection.  He endorses some pain while urinating described as a slight discomfort.  Symptoms have been ongoing for the past few days.  He has a history of urethritis and prior STIs.  He denies any penile discharge or testicular pain.  He denies any fevers or abdominal pain.  He consents to STI testing.  Home Medications Prior to Admission medications   Medication Sig Start Date End Date Taking? Authorizing Provider  doxycycline (VIBRAMYCIN) 100 MG capsule Take 1 capsule (100 mg total) by mouth 2 (two) times daily for 7 days. 05/05/22 05/12/22 Yes Ernie Avena, MD  Multiple Vitamins-Minerals (MULTIVITAMIN WITH MINERALS) tablet Take 1 tablet by mouth daily.    [provider]  naproxen (NAPROSYN) 500 MG tablet Take 1 tablet (500 mg total) by mouth 2 (two) times daily. 09/16/17   Janne Napoleon, NP  oxyCODONE-acetaminophen (PERCOCET/ROXICET) 5-325 MG tablet Take 1 tablet by mouth every 6 (six) hours as needed for severe pain. 09/08/17   Hedges, Tinnie Gens, PA-C  tamsulosin (FLOMAX) 0.4 MG CAPS capsule Take 1 capsule (0.4 mg total) by mouth daily. 03/07/20   McDonald, Mia A, PA-C      Allergies    Patient has no known allergies.    Review of Systems   Review of Systems  All other systems reviewed and are negative.   Physical Exam Updated Vital Signs BP (!) 140/90   Pulse 69   Temp 98.4 F (36.9 C)   Resp 16   Ht 5\' 11"  (1.803 m)   Wt 76 kg   SpO2 99%   BMI 23.37 kg/m  Physical Exam Vitals and nursing note reviewed. Exam conducted with a chaperone present.  Constitutional:      General: He is not in acute distress. HENT:      Head: Normocephalic and atraumatic.  Eyes:     Conjunctiva/sclera: Conjunctivae normal.     Pupils: Pupils are equal, round, and reactive to light.  Cardiovascular:     Rate and Rhythm: Normal rate and regular rhythm.  Pulmonary:     Effort: Pulmonary effort is normal. No respiratory distress.  Abdominal:     General: There is no distension.     Tenderness: There is no guarding.  Genitourinary:    Comments: No discharge, no scrotal pain or swelling, no tenderness of the epididymis, no lesions along the penis Musculoskeletal:        General: No deformity or signs of injury.     Cervical back: Neck supple.  Skin:    Findings: No lesion or rash.  Neurological:     General: No focal deficit present.     Mental Status: He is alert. Mental status is at baseline.     ED Results / Procedures / Treatments   Labs (all labs ordered are listed, but only abnormal results are displayed) Labs Reviewed  URINALYSIS, ROUTINE W REFLEX MICROSCOPIC - Abnormal; Notable for the following components:      Result Value   APPearance CLOUDY (*)    All other components within normal limits  RPR  HIV ANTIBODY (ROUTINE TESTING W  REFLEX)  GC/CHLAMYDIA PROBE AMP ([place]) NOT AT [facility]    EKG None  Radiology No results found.  Procedures Procedures    Medications Ordered in ED Medications  cefTRIAXone (ROCEPHIN) injection 500 mg (500 mg Intramuscular Given 05/05/22 1934)  doxycycline (VIBRA-TABS) tablet 100 mg (100 mg Oral Given 05/05/22 1935)    ED Course/ Medical Decision Making/ A&P                           Medical Decision Making Risk Prescription drug management.    41 year old male presenting to the Emergency Department with concern for STI.  The patient states that he may have an infection.  He endorses some pain while urinating described as a slight discomfort.  Symptoms have been ongoing for the past few days.  He has a history of urethritis and prior STIs.  He denies any  penile discharge or testicular pain.  He denies any fevers or abdominal pain.  He consents to STI testing.  On arrival, the patient was vitally stable.  Presenting for STI testing in the setting of mild symptoms of urethritis with a history of the same.  Will cover empirically with Rocephin and doxycycline.  Patient advised to follow-up outpatient for results of testing.   Final Clinical Impression(s) / ED Diagnoses Final diagnoses:  Dysuria    Rx / DC Orders ED Discharge Orders          Ordered    doxycycline (VIBRAMYCIN) 100 MG capsule  2 times daily        05/05/22 1908              Ernie Avena, MD 05/05/22 1936

## 2022-05-06 LAB — RPR: RPR Ser Ql: NONREACTIVE

## 2022-07-06 ENCOUNTER — Other Ambulatory Visit: Payer: Self-pay

## 2022-07-06 ENCOUNTER — Encounter (HOSPITAL_COMMUNITY): Payer: Self-pay | Admitting: Emergency Medicine

## 2022-07-06 ENCOUNTER — Emergency Department (HOSPITAL_COMMUNITY)
Admission: EM | Admit: 2022-07-06 | Discharge: 2022-07-06 | Disposition: A | Discharge status: Home / Self Care | Payer: Self-pay | Attending: Emergency Medicine | Admitting: Emergency Medicine

## 2022-07-06 DIAGNOSIS — Z202 Contact with and (suspected) exposure to infections with a predominantly sexual mode of transmission: Secondary | ICD-10-CM | POA: Insufficient documentation

## 2022-07-06 LAB — URINALYSIS, ROUTINE W REFLEX MICROSCOPIC
Bilirubin Urine: NEGATIVE
Glucose, UA: NEGATIVE mg/dL
Hgb urine dipstick: NEGATIVE
Ketones, ur: NEGATIVE mg/dL
Leukocytes,Ua: NEGATIVE
Nitrite: NEGATIVE
Protein, ur: NEGATIVE mg/dL
Specific Gravity, Urine: 1.017 (ref 1.005–1.030)
pH: 5 (ref 5.0–8.0)

## 2022-07-06 LAB — HIV ANTIBODY (ROUTINE TESTING W REFLEX): HIV Screen 4th Generation wRfx: NONREACTIVE

## 2022-07-06 MED ORDER — LIDOCAINE HCL (PF) 1 % IJ SOLN
1.0000 mL | Freq: Once | INTRAMUSCULAR | Status: AC
  Administered 2022-07-06: 2.1 mL
  Filled 2022-07-06: qty 30

## 2022-07-06 MED ORDER — CEFTRIAXONE SODIUM 1 G IJ SOLR
500.0000 mg | Freq: Once | INTRAMUSCULAR | Status: AC
  Administered 2022-07-06: 500 mg via INTRAMUSCULAR
  Filled 2022-07-06: qty 10

## 2022-07-06 MED ORDER — DOXYCYCLINE HYCLATE 100 MG PO TABS: 100.0000 mg | ORAL_TABLET | Freq: Two times a day (BID) | ORAL | 0 refills | Status: DC

## 2022-07-06 NOTE — ED Provider Notes (Signed)
[facility] Provider Note   CSN: [id number] Arrival date & time: 07/06/22  1611     History  Chief Complaint  Patient presents with   SEXUALLY TRANSMITTED DISEASE    Jeff Ramos is a 42 y.o. male who presents to the Emergency Department complaining of clear penile discharge onset 4 days.  Notes that he recently had unprotected oral intercourse with a partner.  Denies his partner telling him of any concerns for an STD.  No meds tried prior to arrival.  Denies abdominal pain, nausea, vomiting, penile pain/swelling, testicular pain/swelling, rash.     The history is provided by the patient. No language interpreter was used.       Home Medications Prior to Admission medications   Medication Sig Start Date End Date Taking? Authorizing Provider  doxycycline (VIBRA-TABS) 100 MG tablet Take 1 tablet (100 mg total) by mouth 2 (two) times daily. 07/06/22  Yes Manish Ruggiero A, PA-C  Multiple Vitamins-Minerals (MULTIVITAMIN WITH MINERALS) tablet Take 1 tablet by mouth daily.    [provider]  naproxen (NAPROSYN) 500 MG tablet Take 1 tablet (500 mg total) by mouth 2 (two) times daily. 09/16/17   Ashley Murrain, NP  oxyCODONE-acetaminophen (PERCOCET/ROXICET) 5-325 MG tablet Take 1 tablet by mouth every 6 (six) hours as needed for severe pain. 09/08/17   Hedges, Dellis Filbert, PA-C  tamsulosin (FLOMAX) 0.4 MG CAPS capsule Take 1 capsule (0.4 mg total) by mouth daily. 03/07/20   McDonald, Mia A, PA-C      Allergies    Patient has no known allergies.    Review of Systems   Review of Systems  All other systems reviewed and are negative.   Physical Exam Updated Vital Signs BP (!) 140/87 (BP Location: Right Arm)   Pulse 75   Temp 98.4 F (36.9 C) (Oral)   Resp 16   Ht 5' 10"$  (1.778 m)   Wt 74.8 kg   SpO2 99%   BMI 23.68 kg/m  Physical Exam Vitals and nursing note reviewed. Exam conducted with a chaperone present.  Constitutional:       General: He is not in acute distress.    Appearance: Normal appearance. He is not ill-appearing.  HENT:     Head: Normocephalic and atraumatic.     Right Ear: External ear normal.     Left Ear: External ear normal.  Eyes:     General: No scleral icterus. Cardiovascular:     Rate and Rhythm: Normal rate and regular rhythm.     Pulses: Normal pulses.     Heart sounds: Normal heart sounds.  Pulmonary:     Effort: Pulmonary effort is normal.     Breath sounds: Normal breath sounds.  Abdominal:     General: Abdomen is flat. Bowel sounds are normal. There is no distension.     Palpations: Abdomen is soft. There is no mass.     Tenderness: There is no abdominal tenderness.     Hernia: There is no hernia in the left inguinal area or right inguinal area.  Genitourinary:    Penis: Normal and circumcised. No erythema, tenderness, discharge or lesions.      Testes: Normal. Cremasteric reflex is present.        Right: Mass, tenderness or swelling not present. Right testis is descended.        Left: Mass, tenderness or swelling not present. Left testis is descended.     Epididymis:  Right: Normal.     Left: Normal.     Comments: RN chaperone present for exam.  Musculoskeletal:        General: Normal range of motion.     Cervical back: Normal range of motion and neck supple.  Lymphadenopathy:     Lower Body: No right inguinal adenopathy. No left inguinal adenopathy.  Skin:    General: Skin is warm and dry.     Findings: No rash.  Neurological:     Mental Status: He is alert.     ED Results / Procedures / Treatments   Labs (all labs ordered are listed, but only abnormal results are displayed) Labs Reviewed  URINALYSIS, ROUTINE W REFLEX MICROSCOPIC  RPR  HIV ANTIBODY (ROUTINE TESTING W REFLEX)  GC/CHLAMYDIA PROBE AMP ([place]) NOT AT [facility]    EKG None  Radiology No results found.  Procedures Procedures    Medications Ordered in ED Medications  cefTRIAXone  (ROCEPHIN) injection 500 mg (has no administration in time range)  lidocaine (PF) (XYLOCAINE) 1 % injection 1-2.1 mL (has no administration in time range)    ED Course/ Medical Decision Making/ A&P                             Medical Decision Making Amount and/or Complexity of Data Reviewed Labs: ordered.   Patient presents to the ED with clear penile discharge onset 4 days. On exam patient with RN chaperone present for exam, otherwise no acute cardiovascular, respiratory, or abdominal exam findings.  Differential diagnosis includes gonorrhea, chlamydia, epididymitis, urethritis.   Labs:  I ordered, and personally interpreted labs.  The pertinent results include:   Urinalysis negative GC/chlamydia, HIV, syphilis ordered and results pending at time of discharge.    Medications:  I ordered medication including Rocephin for STD prophylaxis  I have reviewed the patients home medicines and have made adjustments as needed   Disposition: Presentation suspicious for possible exposure to STD. Doubt concerns for epididymitis or urethritis at this time. After consideration of the diagnostic results and the patients response to treatment, I feel that the patient would benefit from Discharge home.  We will send a prescription for doxycycline due to symptoms and exposure.  Patient advised to inform and treat all sexual partners.  Pt advised on safe sex practices and understands that they have GC/Chlamydia cultures pending and will result in 2-3 days. Patient did not want additional testing at this time. Patient would like HIV and RPR sent today, will obtain and send. Pt encouraged to follow up at local health department for future STI checks. Supportive care measures and strict return precautions discussed with patient at bedside. Pt acknowledges and verbalizes understanding. Pt appears safe for discharge. Follow up as indicated in discharge paperwork.    This chart was dictated using voice  recognition software, Dragon. Despite the best efforts of this provider to proofread and correct errors, errors may still occur which can change documentation meaning.   Final Clinical Impression(s) / ED Diagnoses Final diagnoses:  Possible exposure to STD    Rx / DC Orders ED Discharge Orders          Ordered    doxycycline (VIBRA-TABS) 100 MG tablet  2 times daily        07/06/22 1707              Ja Pistole A, PA-C 07/06/22 Daron Offer, MD 07/09/22 1103

## 2022-07-06 NOTE — Discharge Instructions (Addendum)
It was a pleasure taking care of you today!  You have pending lab results for STI work-up.  You may see the results of your labs on MyChart.  You were treated today for gonorrhea and chlamydia due to your symptoms and exposure.  You will be sent a prescription for doxycycline, ensure to complete the entire course of antibiotics prescribed. You may follow up with your primary care provider or Health Department if you are experiencing continued symptoms.  If you have future STI concerns you may follow-up with the [facility] for Infectious Disease [phone]).  This location provides testing and treatment for STI and prep services.  This is free for individuals without insurance.  Appointments are required and typically you can get in on the same day.  You may also follow-up with the [facility].  Ensure to practice safe sex with condom use. Return to the Emergency Department if you are experiencing increasing/worsening symptoms.

## 2022-07-06 NOTE — ED Triage Notes (Signed)
Patient concerned for bacterial infection to urethra. Patient reports clear discharge about 4 days ago.

## 2022-07-07 LAB — RPR: RPR Ser Ql: NONREACTIVE

## 2022-07-08 LAB — GC/CHLAMYDIA PROBE AMP (~~LOC~~) NOT AT ARMC
Chlamydia: NEGATIVE
Comment: NEGATIVE
Comment: NORMAL
Neisseria Gonorrhea: NEGATIVE

## 2023-07-07 ENCOUNTER — Emergency Department (HOSPITAL_COMMUNITY)
Admission: EM | Admit: 2023-07-07 | Discharge: 2023-07-07 | Disposition: A | Discharge status: Home / Self Care | Payer: Self-pay | Attending: Emergency Medicine | Admitting: Emergency Medicine

## 2023-07-07 ENCOUNTER — Other Ambulatory Visit: Payer: Self-pay

## 2023-07-07 ENCOUNTER — Encounter (HOSPITAL_COMMUNITY): Payer: Self-pay | Admitting: *Deleted

## 2023-07-07 DIAGNOSIS — R369 Urethral discharge, unspecified: Secondary | ICD-10-CM | POA: Insufficient documentation

## 2023-07-07 DIAGNOSIS — Z711 Person with feared health complaint in whom no diagnosis is made: Secondary | ICD-10-CM

## 2023-07-07 DIAGNOSIS — Z202 Contact with and (suspected) exposure to infections with a predominantly sexual mode of transmission: Secondary | ICD-10-CM | POA: Insufficient documentation

## 2023-07-07 LAB — URINALYSIS, ROUTINE W REFLEX MICROSCOPIC
Bilirubin Urine: NEGATIVE
Glucose, UA: NEGATIVE mg/dL
Hgb urine dipstick: NEGATIVE
Ketones, ur: NEGATIVE mg/dL
Leukocytes,Ua: NEGATIVE
Nitrite: NEGATIVE
Protein, ur: NEGATIVE mg/dL
Specific Gravity, Urine: 1.023 (ref 1.005–1.030)
pH: 6 (ref 5.0–8.0)

## 2023-07-07 MED ORDER — CEFTRIAXONE SODIUM 1 G IJ SOLR
500.0000 mg | Freq: Once | INTRAMUSCULAR | Status: AC
  Administered 2023-07-07: 500 mg via INTRAMUSCULAR
  Filled 2023-07-07: qty 10

## 2023-07-07 MED ORDER — STERILE WATER FOR INJECTION IJ SOLN
INTRAMUSCULAR | Status: AC
  Administered 2023-07-07: 2.1 mL
  Filled 2023-07-07: qty 10

## 2023-07-07 NOTE — Discharge Instructions (Addendum)
You have been given antibiotic as prophylactic treatment for potential sexually transmitted infection.  You may go to the [facility] for further evaluation and managements of your concern of STD.

## 2023-07-07 NOTE — ED Triage Notes (Signed)
Here by POV from home for penile d/c. Denies other sx. Onset last week. Denies urinary sx, pain, fever, NVD, rash, itching or lesions. Alert, NAD, calm, interactive, steady gait.

## 2023-07-07 NOTE — ED Provider Notes (Signed)
[place] EMERGENCY DEPARTMENT AT [facility] Provider Note   CSN: [id number] Arrival date & time: 07/07/23  1506     History  Chief Complaint  Patient presents with   Penile Discharge    JAVIN NONG is a 43 y.o. male.  The history is provided by the patient and medical records. No language interpreter was used.  Penile Discharge     43 year old male presenting with complaints of penile discharge.  Patient report for the past week he noticed some mild wetness and irritation at the tip of his penis.  He noted some clear discharge.  He did report having oral sex 2 weeks prior but no penetration.  He denies having any rash denies any scrotal pain no fever no abdominal pain.  He would like to be checked for potential STD.  Patient has been seen evaluate multiple times for similar complaint.  Home Medications Prior to Admission medications   Medication Sig Start Date End Date Taking? Authorizing Provider  doxycycline (VIBRA-TABS) 100 MG tablet Take 1 tablet (100 mg total) by mouth 2 (two) times daily. 07/06/22   Blue, Soijett A, PA-C  Multiple Vitamins-Minerals (MULTIVITAMIN WITH MINERALS) tablet Take 1 tablet by mouth daily.    [provider]  naproxen (NAPROSYN) 500 MG tablet Take 1 tablet (500 mg total) by mouth 2 (two) times daily. 09/16/17   Janne Napoleon, NP  oxyCODONE-acetaminophen (PERCOCET/ROXICET) 5-325 MG tablet Take 1 tablet by mouth every 6 (six) hours as needed for severe pain. 09/08/17   Hedges, Tinnie Gens, PA-C  tamsulosin (FLOMAX) 0.4 MG CAPS capsule Take 1 capsule (0.4 mg total) by mouth daily. 03/07/20   McDonald, Mia A, PA-C      Allergies    Patient has no known allergies.    Review of Systems   Review of Systems  Genitourinary:  Positive for penile discharge.  All other systems reviewed and are negative.   Physical Exam Updated Vital Signs BP (!) 144/91 (BP Location: Left Arm)   Pulse 70   Temp 97.7 F (36.5 C) (Oral)   Resp 16    Wt 77.1 kg   SpO2 100%   BMI 24.39 kg/m  Physical Exam Vitals and nursing note reviewed.  Constitutional:      General: He is not in acute distress.    Appearance: He is well-developed.  HENT:     Head: Atraumatic.  Eyes:     Conjunctiva/sclera: Conjunctivae normal.  Genitourinary:    Comments: Genital exam deferred Musculoskeletal:     Cervical back: Neck supple.  Skin:    Findings: No rash.  Neurological:     Mental Status: He is alert.     ED Results / Procedures / Treatments   Labs (all labs ordered are listed, but only abnormal results are displayed) Labs Reviewed  URINALYSIS, ROUTINE W REFLEX MICROSCOPIC  GC/CHLAMYDIA PROBE AMP ([place]) NOT AT [facility]    EKG None  Radiology No results found.  Procedures Procedures    Medications Ordered in ED Medications  cefTRIAXone (ROCEPHIN) injection 500 mg (has no administration in time range)    ED Course/ Medical Decision Making/ A&P                                 Medical Decision Making Amount and/or Complexity of Data Reviewed Labs: ordered.   BP (!) 144/91 (BP Location: Left Arm)   Pulse 70   Temp  97.7 F (36.5 C) (Oral)   Resp 16   Wt 77.1 kg   SpO2 100%   BMI 24.39 kg/m   48:59 PM  43 year old male presenting with complaints of penile discharge.  Patient report for the past week he noticed some mild wetness and irritation at the tip of his penis.  He noted some clear discharge.  He did report having oral sex 2 weeks prior but no penetration.  He denies having any rash denies any scrotal pain no fever no abdominal pain.  He would like to be checked for potential STD.  Patient has been seen evaluate multiple times for similar complaint.  On exam this is a well-appearing male resting comfortably appears to be in no acute discomfort.  Genital exam was not performed during this visit.  EMR reviewed patient has been seen and been evaluated multiple times in the ER for penile irritation and penile  discharge.  He has tested positive for gonorrhea infection in the past.  At this time, I strong encouraged patient to find a primary care provider or to go to [facility] health clinic for STD testing as his symptoms are reoccurring in nature.  I recommend safe sex practice.  Will provide Rocephin 500 mg IM for STD prophylaxis.  I have low suspicion for Fournier gangrene, herpes, syphilis, HIV.        Final Clinical Impression(s) / ED Diagnoses Final diagnoses:  Concern about STD in male without diagnosis    Rx / DC Orders ED Discharge Orders     None         Fayrene Helper, PA-C 07/07/23 1708    Lorre Nick, MD 07/07/23 2257

## 2023-07-08 LAB — GC/CHLAMYDIA PROBE AMP (~~LOC~~) NOT AT ARMC
Chlamydia: NEGATIVE
Comment: NEGATIVE
Comment: NORMAL
Neisseria Gonorrhea: NEGATIVE

## 2023-08-03 ENCOUNTER — Emergency Department (HOSPITAL_COMMUNITY)
Admission: EM | Admit: 2023-08-03 | Discharge: 2023-08-03 | Disposition: A | Discharge status: Home / Self Care | Payer: Self-pay | Attending: Emergency Medicine | Admitting: Emergency Medicine

## 2023-08-03 ENCOUNTER — Other Ambulatory Visit: Payer: Self-pay

## 2023-08-03 ENCOUNTER — Encounter (HOSPITAL_COMMUNITY): Payer: Self-pay | Admitting: *Deleted

## 2023-08-03 DIAGNOSIS — R21 Rash and other nonspecific skin eruption: Secondary | ICD-10-CM | POA: Insufficient documentation

## 2023-08-03 NOTE — Discharge Instructions (Addendum)
 It was a pleasure caring for you today. You may take benadryl if your rash becomes itchy. I do not recommend placing anything with dyes or fragrances to your skin until it is healed. Please call primary care and dermatology first thing in the morning. Seek emergency care if experiencing any new or worsening symptoms.

## 2023-08-03 NOTE — ED Provider Notes (Signed)
 [place] EMERGENCY DEPARTMENT AT [facility] Provider Note   CSN: [id number] Arrival date & time: 08/03/23  1718     History  Chief Complaint  Patient presents with   Rash    Jeff Ramos is a 43 y.o. male who presents to ED concerned for rash x1 week. Denies bug bites. Family members without similar rash. Patient unsure if he has been using any new soaps or detergents. No fever, nausea, vomiting, diarrhea, abdominal pain, chest pain, SOB.    Rash      Home Medications Prior to Admission medications   Medication Sig Start Date End Date Taking? Authorizing Provider  doxycycline (VIBRA-TABS) 100 MG tablet Take 1 tablet (100 mg total) by mouth 2 (two) times daily. 07/06/22   Blue, Soijett A, PA-C  Multiple Vitamins-Minerals (MULTIVITAMIN WITH MINERALS) tablet Take 1 tablet by mouth daily.    [provider]  naproxen (NAPROSYN) 500 MG tablet Take 1 tablet (500 mg total) by mouth 2 (two) times daily. 09/16/17   Janne Napoleon, NP  oxyCODONE-acetaminophen (PERCOCET/ROXICET) 5-325 MG tablet Take 1 tablet by mouth every 6 (six) hours as needed for severe pain. 09/08/17   Hedges, Tinnie Gens, PA-C  tamsulosin (FLOMAX) 0.4 MG CAPS capsule Take 1 capsule (0.4 mg total) by mouth daily. 03/07/20   McDonald, Mia A, PA-C      Allergies    Patient has no known allergies.    Review of Systems   Review of Systems  Skin:  Positive for rash.    Physical Exam Updated Vital Signs BP (!) 140/89 (BP Location: Right Arm)   Pulse 64   Temp 97.9 F (36.6 C) (Oral)   Resp 19   Ht 5\' 10"  (1.778 m)   Wt 77.1 kg   SpO2 100%   BMI 24.39 kg/m  Physical Exam Vitals and nursing note reviewed.  Constitutional:      General: He is not in acute distress.    Appearance: He is not ill-appearing or toxic-appearing.  HENT:     Head: Normocephalic and atraumatic.  Eyes:     General: No scleral icterus.       Right eye: No discharge.        Left eye: No discharge.      Conjunctiva/sclera: Conjunctivae normal.  Cardiovascular:     Rate and Rhythm: Normal rate.  Pulmonary:     Effort: Pulmonary effort is normal.  Abdominal:     General: Abdomen is flat.  Skin:    General: Skin is warm and dry.     Comments: Approx 3-4cm area of tiny scattered vesicles without underlying swelling or erythema. No rash of innerweb of fingers.   Neurological:     General: No focal deficit present.     Mental Status: He is alert. Mental status is at baseline.  Psychiatric:        Mood and Affect: Mood normal.        Behavior: Behavior normal.     ED Results / Procedures / Treatments   Labs (all labs ordered are listed, but only abnormal results are displayed) Labs Reviewed - No data to display  EKG None  Radiology No results found.  Procedures Procedures    Medications Ordered in ED Medications - No data to display  ED Course/ Medical Decision Making/ A&P  Medical Decision Making  This patient presents to the ED for concern of rash, this involves an extensive number of treatment options, and is a complaint that carries with it a high risk of complications and morbidity.  The differential diagnosis includes irritant contact dermatitis, DRESS, atopic dermatitis, anaphylaxis, SJS/TEN   Co morbidities that complicate the patient evaluation  none   Additional history obtained:  No PCP listed in chart. Referring to [facility]   Problem List / ED Course / Critical interventions / Medication management  Patient presents to ED concerned for rash x1 week. Physical exam concerning for mild case of contact dermatitis. No signs of infection. Rest of physical exam reassuring. Patient afebrile with stable vitals. Denies any infectious symptoms today.  Educated patient to only use sensitive-unscented products on his skin. Patient does not follow with PCP - referred to [facility] and patient agrees to call them tomorrow.  Also provided patient with information for dermatologist - he also agrees to call dermatologist in the morning for follow up appointment.  I have reviewed the patients home medicines and have made adjustments as needed Patient afebrile with stable vitals. Provided with return precautions. Discharged in good condition.  Ddx: these are considered less likely due to history of present illness and physical exam -DRESS: patient afebrile, stable vitals  -atopic dermatitis: rash diffuse, not confined to flexural areas -SJS/TEN: negative Nikolsky sign -[place] Spotted Fever/Lyme Disease: no hx tick bite or fever   Social Determinants of Health:  none          Final Clinical Impression(s) / ED Diagnoses Final diagnoses:  Rash    Rx / DC Orders ED Discharge Orders     None         Dorthy Cooler, [place] 08/03/23 1911    Linwood Dibbles, MD 08/03/23 2332

## 2023-08-03 NOTE — ED Triage Notes (Signed)
 The pt has had a rash for one week it does not itch

## 2024-06-06 ENCOUNTER — Emergency Department (HOSPITAL_COMMUNITY)
Admission: EM | Admit: 2024-06-06 | Discharge: 2024-06-07 | Discharge status: Against Medical Advice | Payer: Self-pay | Attending: Emergency Medicine | Admitting: Emergency Medicine

## 2024-06-06 DIAGNOSIS — R369 Urethral discharge, unspecified: Secondary | ICD-10-CM | POA: Insufficient documentation

## 2024-06-06 DIAGNOSIS — Z5329 Procedure and treatment not carried out because of patient's decision for other reasons: Secondary | ICD-10-CM | POA: Insufficient documentation

## 2024-06-06 LAB — URINALYSIS, ROUTINE W REFLEX MICROSCOPIC
Bilirubin Urine: NEGATIVE
Glucose, UA: NEGATIVE mg/dL
Hgb urine dipstick: NEGATIVE
Ketones, ur: NEGATIVE mg/dL
Leukocytes,Ua: NEGATIVE
Nitrite: NEGATIVE
Protein, ur: NEGATIVE mg/dL
Specific Gravity, Urine: 1.02 (ref 1.005–1.030)
pH: 6 (ref 5.0–8.0)

## 2024-06-06 LAB — HIV ANTIBODY (ROUTINE TESTING W REFLEX): HIV Screen 4th Generation wRfx: NONREACTIVE

## 2024-06-06 NOTE — ED Provider Triage Note (Signed)
 Emergency Medicine Provider Triage Evaluation Note  Jeff Ramos , a 44 y.o. male  was evaluated in triage.  Pt complains of penile discharge.  Patient reports a clear discharge with some slight urethral irritation over the last several days.  Denies any significant dysuria or hematuria.  No reported testicular pain.  Reports he is sexually active with 1 partner and this has been a prolonged partner form.  Denies any white, yellow, or green type discharge.  No reported lesions to the penis or the skin around this area.  Review of Systems  Positive: As above Negative: As above  Physical Exam  BP 139/86 (BP Location: Right Arm)   Pulse 65   Temp 98.4 F (36.9 C) (Oral)   Resp 18   SpO2 100%  Gen:   Awake, no distress   Resp:  Normal effort  MSK:   Moves extremities without difficulty  Other:    Medical Decision Making  Medically screening exam initiated at 4:57 PM.  Appropriate orders placed.  SYED ZUKAS was informed that the remainder of the evaluation will be completed by another provider, this initial triage assessment does not replace that evaluation, and the importance of remaining in the ED until their evaluation is complete.    Era Parr A, PA-C 06/06/24 1658

## 2024-06-06 NOTE — ED Triage Notes (Signed)
 C/o clear penile discharge for the last few days. Denies any pain. Occurred a few years ago and patient had a bacterial infection.

## 2024-06-07 LAB — GC/CHLAMYDIA PROBE AMP (~~LOC~~) NOT AT ARMC
Chlamydia: NEGATIVE
Comment: NEGATIVE
Comment: NORMAL
Neisseria Gonorrhea: NEGATIVE

## 2024-06-07 LAB — SYPHILIS: RPR W/REFLEX TO RPR TITER AND TREPONEMAL ANTIBODIES, TRADITIONAL SCREENING AND DIAGNOSIS ALGORITHM: RPR Ser Ql: NONREACTIVE

## 2024-06-14 ENCOUNTER — Other Ambulatory Visit: Payer: Self-pay

## 2024-06-14 ENCOUNTER — Encounter (HOSPITAL_COMMUNITY): Payer: Self-pay | Admitting: Emergency Medicine

## 2024-06-14 ENCOUNTER — Emergency Department (HOSPITAL_COMMUNITY)
Admission: EM | Admit: 2024-06-14 | Discharge: 2024-06-14 | Disposition: A | Discharge status: Home / Self Care | Payer: Self-pay | Attending: Emergency Medicine | Admitting: Emergency Medicine

## 2024-06-14 DIAGNOSIS — N341 Nonspecific urethritis: Secondary | ICD-10-CM | POA: Insufficient documentation

## 2024-06-14 MED ORDER — LIDOCAINE HCL (PF) 1 % IJ SOLN: 1.0000 mL | Freq: Once | INTRAMUSCULAR | Status: DC

## 2024-06-14 MED ORDER — CEFTRIAXONE SODIUM 1 G IJ SOLR: 500.0000 mg | Freq: Once | INTRAMUSCULAR | Status: DC

## 2024-06-14 MED ORDER — METRONIDAZOLE 500 MG PO TABS: 500.0000 mg | ORAL_TABLET | Freq: Two times a day (BID) | ORAL | 0 refills | Status: AC

## 2024-06-14 MED ORDER — DOXYCYCLINE HYCLATE 100 MG PO TABS: 100.0000 mg | ORAL_TABLET | Freq: Two times a day (BID) | ORAL | 0 refills | Status: DC

## 2024-06-14 NOTE — ED Triage Notes (Signed)
 Pt with clear penile discharge.  Reports he has been evaluated for same. No pain, no urinary symptoms, worried about bacteria

## 2024-06-14 NOTE — Discharge Instructions (Signed)
 Your labs were negative on 1/14. You will be treated for urethritis. Follow up with urology if your symptoms do not improve. Get help right away for any new or worsening symptoms.

## 2024-06-14 NOTE — ED Provider Notes (Signed)
 " [place] EMERGENCY DEPARTMENT AT [facility] Provider Note   CSN: [id number] Arrival date & time: 06/14/24  1559     Patient presents with: Penile Discharge   Jeff Ramos is a 44 y.o. male who presents to the emergency department for clear penile discharge.  Patient came to the emergency department on 114 for the same complaint and had labs drawn and run.  Review of previously drawn labs show negative gonorrhea chlamydia, normal urine negative HIV and RPR.  Patient reports that he had oral intercourse only.  Review of EMR shows that the patient has been here for several years complaining of the same complaint of clear penile discharge    Penile Discharge       Prior to Admission medications  Medication Sig Start Date End Date Taking? Authorizing Provider  doxycycline  (VIBRA -TABS) 100 MG tablet Take 1 tablet (100 mg total) by mouth 2 (two) times daily. 07/06/22   Blue, Soijett A, PA-C  Multiple Vitamins-Minerals (MULTIVITAMIN WITH MINERALS) tablet Take 1 tablet by mouth daily.    [provider]  naproxen  (NAPROSYN ) 500 MG tablet Take 1 tablet (500 mg total) by mouth 2 (two) times daily. 09/16/17   Jamelle Lorrayne HERO, NP  oxyCODONE -acetaminophen  (PERCOCET/ROXICET) 5-325 MG tablet Take 1 tablet by mouth every 6 (six) hours as needed for severe pain. 09/08/17   Hedges, Reyes, PA-C  tamsulosin  (FLOMAX ) 0.4 MG CAPS capsule Take 1 capsule (0.4 mg total) by mouth daily. 03/07/20   McDonald, Mia A, PA-C    Allergies: Patient has no known allergies.    Review of Systems  Genitourinary:  Positive for penile discharge.    Updated Vital Signs BP (!) 131/92 (BP Location: Right Arm)   Pulse 63   Temp 98.2 F (36.8 C) (Oral)   Resp 16   SpO2 100%   Physical Exam Vitals and nursing note reviewed.  Constitutional:      General: He is not in acute distress.    Appearance: He is well-developed. He is not diaphoretic.  HENT:     Head: Normocephalic and atraumatic.   Eyes:     General: No scleral icterus.    Conjunctiva/sclera: Conjunctivae normal.  Cardiovascular:     Rate and Rhythm: Normal rate and regular rhythm.     Heart sounds: Normal heart sounds.  Pulmonary:     Effort: Pulmonary effort is normal. No respiratory distress.     Breath sounds: Normal breath sounds.  Abdominal:     Palpations: Abdomen is soft.     Tenderness: There is no abdominal tenderness.  Musculoskeletal:     Cervical back: Normal range of motion and neck supple.  Skin:    General: Skin is warm and dry.  Neurological:     Mental Status: He is alert.  Psychiatric:        Behavior: Behavior normal.     (all labs ordered are listed, but only abnormal results are displayed) Labs Reviewed - No data to display  EKG: None  Radiology: No results found.   Procedures   Medications Ordered in the ED - No data to display                                  Medical Decision Making Risk Prescription drug management.   Review of previous ED visit showed that he comes very frequently for the same complaint.  Does not appear that he  has any cause that is related to STI.  Will treat with Flagyl  to cover for nongonococcal and not a chlamydial causes.  Patient will be given a referral to urology for further evaluation.  No other complaints at this time appropriate for addition     Final diagnoses:  None    ED Discharge Orders     None          Arloa Chroman, PA-C 06/14/24 2145    Elnor Jayson LABOR, DO 06/16/24 1549  "
